# Patient Record
Sex: Male | Born: 1980
Health system: Southern US, Community
[De-identification: ages and names within clinical notes are randomized; demographics above are authoritative.]

## PROBLEM LIST (undated history)

## (undated) DIAGNOSIS — K2970 Gastritis, unspecified, without bleeding: Secondary | ICD-10-CM

## (undated) DIAGNOSIS — F1010 Alcohol abuse, uncomplicated: Secondary | ICD-10-CM

## (undated) DIAGNOSIS — K2990 Gastroduodenitis, unspecified, without bleeding: Secondary | ICD-10-CM

## (undated) DIAGNOSIS — Z9049 Acquired absence of other specified parts of digestive tract: Secondary | ICD-10-CM

## (undated) DIAGNOSIS — F419 Anxiety disorder, unspecified: Secondary | ICD-10-CM

## (undated) DIAGNOSIS — K219 Gastro-esophageal reflux disease without esophagitis: Secondary | ICD-10-CM

## (undated) DIAGNOSIS — G479 Sleep disorder, unspecified: Secondary | ICD-10-CM

## (undated) DIAGNOSIS — A048 Other specified bacterial intestinal infections: Secondary | ICD-10-CM

## (undated) HISTORY — DX: Gastro-esophageal reflux disease without esophagitis: K21.9

## (undated) HISTORY — DX: Gastroduodenitis, unspecified, without bleeding: K29.90

## (undated) HISTORY — DX: Acquired absence of other specified parts of digestive tract: Z90.49

## (undated) HISTORY — DX: Gastritis, unspecified, without bleeding: K29.70

## (undated) HISTORY — DX: Anxiety disorder, unspecified: F41.9

## (undated) HISTORY — DX: Sleep disorder, unspecified: G47.9

## (undated) HISTORY — DX: Other specified bacterial intestinal infections: A04.8

## (undated) HISTORY — DX: Alcohol abuse, uncomplicated: F10.10

---

## 2005-10-15 HISTORY — PX: ANKLE SURGERY: SHX546

## 2007-11-19 ENCOUNTER — Ambulatory Visit: Payer: Self-pay | Admitting: Internal Medicine

## 2007-11-19 LAB — CONVERTED CEMR LAB
AST: 20 units/L (ref 0–37)
Albumin: 5.1 g/dL (ref 3.5–5.2)
Alkaline Phosphatase: 76 units/L (ref 39–117)
Basophils Relative: 0 % (ref 0–1)
Eosinophils Absolute: 0 10*3/uL (ref 0.0–0.7)
Glucose, Bld: 103 mg/dL — ABNORMAL HIGH (ref 70–99)
LDL Cholesterol: 100 mg/dL — ABNORMAL HIGH (ref 0–99)
Lymphs Abs: 2.3 10*3/uL (ref 0.7–4.0)
MCV: 94.8 fL (ref 78.0–100.0)
Neutrophils Relative %: 64 % (ref 43–77)
Platelets: 226 10*3/uL (ref 150–400)
Potassium: 5 meq/L (ref 3.5–5.3)
Sodium: 139 meq/L (ref 135–145)
Total Protein: 8.3 g/dL (ref 6.0–8.3)
WBC: 8.9 10*3/uL (ref 4.0–10.5)

## 2007-12-15 ENCOUNTER — Emergency Department (HOSPITAL_COMMUNITY): Admission: EM | Admit: 2007-12-15 | Discharge: 2007-12-15 | Payer: Self-pay | Admitting: Family Medicine

## 2008-03-10 ENCOUNTER — Encounter (INDEPENDENT_AMBULATORY_CARE_PROVIDER_SITE_OTHER): Payer: Self-pay | Admitting: Family Medicine

## 2008-03-10 ENCOUNTER — Ambulatory Visit: Payer: Self-pay | Admitting: Family Medicine

## 2008-03-10 LAB — CONVERTED CEMR LAB

## 2008-04-08 ENCOUNTER — Ambulatory Visit: Payer: Self-pay | Admitting: *Deleted

## 2008-04-08 ENCOUNTER — Ambulatory Visit: Payer: Self-pay | Admitting: Family Medicine

## 2008-11-09 ENCOUNTER — Ambulatory Visit: Payer: Self-pay | Admitting: Internal Medicine

## 2008-11-09 ENCOUNTER — Encounter (INDEPENDENT_AMBULATORY_CARE_PROVIDER_SITE_OTHER): Payer: Self-pay | Admitting: Adult Health

## 2008-12-07 ENCOUNTER — Emergency Department (HOSPITAL_COMMUNITY): Admission: EM | Admit: 2008-12-07 | Discharge: 2008-12-07 | Payer: Self-pay | Admitting: Family Medicine

## 2009-01-03 ENCOUNTER — Ambulatory Visit: Payer: Self-pay | Admitting: Internal Medicine

## 2009-01-11 ENCOUNTER — Ambulatory Visit (HOSPITAL_COMMUNITY): Admission: RE | Admit: 2009-01-11 | Discharge: 2009-01-11 | Payer: Self-pay | Admitting: Internal Medicine

## 2009-06-24 ENCOUNTER — Ambulatory Visit: Payer: Self-pay | Admitting: Internal Medicine

## 2009-07-27 ENCOUNTER — Ambulatory Visit: Payer: Self-pay | Admitting: Internal Medicine

## 2011-05-13 ENCOUNTER — Inpatient Hospital Stay (INDEPENDENT_AMBULATORY_CARE_PROVIDER_SITE_OTHER)
Admission: RE | Admit: 2011-05-13 | Discharge: 2011-05-13 | Disposition: A | Discharge status: Home / Self Care | Payer: Self-pay | Source: Ambulatory Visit | Attending: Family Medicine | Admitting: Family Medicine

## 2011-05-13 DIAGNOSIS — L03039 Cellulitis of unspecified toe: Secondary | ICD-10-CM

## 2011-07-09 LAB — INFLUENZA A AND B ANTIGEN (CONVERTED LAB): Inflenza A Ag: POSITIVE — AB

## 2011-12-24 ENCOUNTER — Encounter: Payer: Self-pay | Admitting: Internal Medicine

## 2012-01-08 ENCOUNTER — Encounter: Payer: Self-pay | Admitting: Internal Medicine

## 2012-01-09 ENCOUNTER — Encounter: Payer: Self-pay | Admitting: Internal Medicine

## 2012-01-09 ENCOUNTER — Ambulatory Visit (INDEPENDENT_AMBULATORY_CARE_PROVIDER_SITE_OTHER): Payer: Self-pay | Admitting: Internal Medicine

## 2012-01-09 VITALS — BP 94/58 | HR 72 | Ht 71.5 in | Wt 221.1 lb

## 2012-01-09 DIAGNOSIS — K3189 Other diseases of stomach and duodenum: Secondary | ICD-10-CM

## 2012-01-09 DIAGNOSIS — R1013 Epigastric pain: Secondary | ICD-10-CM | POA: Insufficient documentation

## 2012-01-09 NOTE — Progress Notes (Signed)
Subjective:    Patient ID: Frank Wilcox, male    DOB: 1980/12/25, 31 y.o.   MRN: [id number]  HPI Mr. Thurnell Garbe is a 31 yo male with little PMH who is seen in consultation at the request of Dr. Venetia Night for evaluation of epigastric pain.  The patient is Spanish-speaking only and is accompanied today by a [place] interpreter.  The patient reports epigastric, burning abdominal pain over the past 7 months. This is specifically worse with eating and associated with nausea. No vomiting. No significant heartburn, dysphagia or odynophagia. No change in bowel habits including no melena or bright red blood per rectum. No weight loss and his appetite is good. He has noticed some abdominal bloating. He was given a trial of PPI therapy, and recently (about 22 days ago) completed a seven-day course of antibiotics for H. pylori infection. He reports initially this seemed to help "a lot", but has issues with burning pain have returned. He does think at this point they're not as strong as before therapy for H. pylori but definitely significant. No fever or chills.  He reports eating several cloves of garlic daily as a home remedy, and wonders if he made things worse by doing so  Review of Systems As per history of present illness, otherwise negative  Past Medical History  Diagnosis Date  . Unspecified gastritis and gastroduodenitis without mention of hemorrhage   . Esophageal reflux   . Alcohol abuse   . Anxiety   . Sleep disturbance, unspecified    No Known Allergies  Current Outpatient Prescriptions  Medication Sig Dispense Refill  . meclizine (ANTIVERT) 25 MG tablet Take 25 mg by mouth 2 (two) times daily as needed.       Marland Kitchen omeprazole (PRILOSEC) 40 MG capsule Take 40 mg by mouth daily.         Family History  Problem Relation Age of Onset  . Diabetes Mother    History   Social History  . Marital Status: Married    Spouse Name: N/A    Number of Children: 1  . Years of Education:  N/A   Occupational History  . Holiday representative Other   Social History Main Topics  . Smoking status: Current Everyday Smoker  . Smokeless tobacco: Never Used  . Alcohol Use: Yes     1-2 per day  . Drug Use: No  . Sexually Active: None   Other Topics Concern  . None   Social History Narrative  . None       Objective:   Physical Exam BP 94/58  Pulse 72  Ht 5' 11.5" (1.816 m)  Wt 221 lb 2 oz (100.302 kg)  BMI 30.41 kg/m2 Constitutional: Well-developed and well-nourished. No distress. HEENT: Normocephalic and atraumatic. Oropharynx is clear and moist. No oropharyngeal exudate. Conjunctivae are normal. Pupils are equal round and reactive to light. No scleral icterus. Neck: Neck supple. Trachea midline. Cardiovascular: Normal rate, regular rhythm and intact distal pulses. No M/R/G Pulmonary/chest: Effort normal and breath sounds normal. No wheezing, rales or rhonchi. Abdominal: Soft, mild epigastric pain without rebound or guarding, nondistended. Bowel sounds active throughout. There are no masses palpable. No hepatosplenomegaly. Extremities: no clubbing, cyanosis, or edema Lymphadenopathy: No cervical adenopathy noted. Neurological: Alert and oriented to person place and time. Skin: Skin is warm and dry. No rashes noted. Psychiatric: Normal mood and affect. Behavior is normal.     Assessment & Plan:   31 yo male with little PMH who is seen in consultation at the  request of Dr. Venetia Night for evaluation of epigastric pain  1. Epigastric pain -- the patient's symptoms are consistent with dyspepsia and possibly related H. pylori infection. I'm not sure if he was objectively tested for H. pylori infection, but he did complete therapy. This seemed to have helped, but his symptoms have returned, which raises the question of incomplete treatment of H. pylori infection. He has remained on daily omeprazole 40 mg, and despite this still has symptoms. Based on these factors, I recommended upper  endoscopy. We discussed the test today including risks and benefits and he is agreeable to proceed. I will plan a gastric biopsies to evaluate for H. pylori at this examination. For now I recommended he continue his daily PPI therapy. Further recommendations can be made after EGD

## 2012-01-09 NOTE — Patient Instructions (Addendum)
You have been given a separate informational sheet regarding your tobacco use, the importance of quitting and local resources to help you quit. You have been scheduled for an endoscopy with propofol. Please follow written instructions given to you at your visit today.  

## 2012-01-31 ENCOUNTER — Ambulatory Visit (AMBULATORY_SURGERY_CENTER): Payer: Self-pay | Admitting: Internal Medicine

## 2012-01-31 ENCOUNTER — Encounter: Payer: Self-pay | Admitting: Internal Medicine

## 2012-01-31 VITALS — BP 124/108 | HR 68 | Temp 96.0°F | Resp 18 | Ht 72.0 in | Wt 221.0 lb

## 2012-01-31 DIAGNOSIS — Z8619 Personal history of other infectious and parasitic diseases: Secondary | ICD-10-CM

## 2012-01-31 DIAGNOSIS — K2970 Gastritis, unspecified, without bleeding: Secondary | ICD-10-CM

## 2012-01-31 DIAGNOSIS — R1013 Epigastric pain: Secondary | ICD-10-CM

## 2012-01-31 DIAGNOSIS — K2990 Gastroduodenitis, unspecified, without bleeding: Secondary | ICD-10-CM

## 2012-01-31 MED ORDER — SODIUM CHLORIDE 0.9 % IV SOLN: 500.0000 mL | INTRAVENOUS | Status: DC

## 2012-01-31 NOTE — Op Note (Signed)
[facility] [address] [place], [place]  [address]  ENDOSCOPY PROCEDURE REPORT  PATIENT:  Frank Wilcox, Frank Wilcox  MR#:  [id number] BIRTHDATE:  May 01, 1981, 30 yrs. old  GENDER:  male ENDOSCOPIST:  Carie Caddy. Adna Nofziger, MD  PROCEDURE DATE:  01/31/2012 PROCEDURE:  EGD with biopsy for H. pylori 19147 ASA CLASS:  Class I INDICATIONS:  epigastric pain, dyspepsia, history of MEDICATIONS:   MAC sedation, administered by CRNA, propofol (Diprivan) 400 mg IV TOPICAL ANESTHETIC:  none  DESCRIPTION OF PROCEDURE:   After the risks benefits and alternatives of the procedure were thoroughly explained, informed consent was obtained.  The LB GIF-H180 [id number] endoscope was introduced through the mouth and advanced to the second portion of the duodenum, without limitations.  The instrument was slowly withdrawn as the mucosa was fully examined. <<PROCEDUREIMAGES>> The esophagus and gastroesophageal junction were completely normal in appearance.  Mild gastritis was found in the body and the antrum of the stomach. Biopsies of the antrum and body of the stomach were obtained and sent to pathology.  The duodenal bulb was normal in appearance, as was the postbulbar duodenum. Retroflexed views revealed no abnormalities.    The scope was then withdrawn from the patient and the procedure completed.  COMPLICATIONS:  None  ENDOSCOPIC IMPRESSION: 1) Normal esophagus 2) Mild gastritis in the body and the antrum of the stomach 3) Normal duodenum RECOMMENDATIONS: 1) Await pathology results 2) Continue taking your PPI (antiacid medicine, omeprazole 40 mg) once daily. It is best to be taken 20-30 minutes prior to breakfast meal.  Carie Caddy. Dyllan Kats, MD  CC:  The Patient Dr. Venetia Night  n. eSIGNED:   Carie Caddy. Cythina Mickelsen at 01/31/2012 04:15 PM  Aliene Beams, [phone]

## 2012-01-31 NOTE — Progress Notes (Signed)
Patient did not experience any of the following events: a burn prior to discharge; a fall within the facility; wrong site/side/patient/procedure/implant event; or a hospital transfer or hospital admission upon discharge from the facility. (G8907) Patient did not have preoperative order for IV antibiotic SSI prophylaxis. (G8918)  

## 2012-01-31 NOTE — Patient Instructions (Addendum)
Discharge instructions given with verbal understanding. Handouts on gastritis given.  Resume previous medications.YOU HAD AN ENDOSCOPIC PROCEDURE TODAY AT THE [facility]: Refer to the procedure report that was given to you for any specific questions about what was found during the examination.  If the procedure report does not answer your questions, please call your gastroenterologist to clarify.  If you requested that your care partner not be given the details of your procedure findings, then the procedure report has been included in a sealed envelope for you to review at your convenience later.  YOU SHOULD EXPECT: Some feelings of bloating in the abdomen. Passage of more gas than usual.  Walking can help get rid of the air that was put into your GI tract during the procedure and reduce the bloating. If you had a lower endoscopy (such as a colonoscopy or flexible sigmoidoscopy) you may notice spotting of blood in your stool or on the toilet paper. If you underwent a bowel prep for your procedure, then you may not have a normal bowel movement for a few days.  DIET: Your first meal following the procedure should be a light meal and then it is ok to progress to your normal diet.  A half-sandwich or bowl of soup is an example of a good first meal.  Heavy or fried foods are harder to digest and may make you feel nauseous or bloated.  Likewise meals heavy in dairy and vegetables can cause extra gas to form and this can also increase the bloating.  Drink plenty of fluids but you should avoid alcoholic beverages for 24 hours.  ACTIVITY: Your care partner should take you home directly after the procedure.  You should plan to take it easy, moving slowly for the rest of the day.  You can resume normal activity the day after the procedure however you should NOT DRIVE or use heavy machinery for 24 hours (because of the sedation medicines used during the test).    SYMPTOMS TO REPORT IMMEDIATELY: A  gastroenterologist can be reached at any hour.  During normal business hours, 8:30 AM to 5:00 PM Monday through Friday, call [phone].  After hours and on weekends, please call the GI answering service at [phone] who will take a message and have the physician on call contact you.   Following upper endoscopy (EGD)  Vomiting of blood or coffee ground material  New chest pain or pain under the shoulder blades  Painful or persistently difficult swallowing  New shortness of breath  Fever of 100F or higher  Black, tarry-looking stools  FOLLOW UP: If any biopsies were taken you will be contacted by phone or by letter within the next 1-3 weeks.  Call your gastroenterologist if you have not heard about the biopsies in 3 weeks.  Our staff will call the home number listed on your records the next business day following your procedure to check on you and address any questions or concerns that you may have at that time regarding the information given to you following your procedure. This is a courtesy call and so if there is no answer at the home number and we have not heard from you through the emergency physician on call, we will assume that you have returned to your regular daily activities without incident.  SIGNATURES/CONFIDENTIALITY: You and/or your care partner have signed paperwork which will be entered into your electronic medical record.  These signatures attest to the fact that that the information above on  your After Visit Summary has been reviewed and is understood.  Full responsibility of the confidentiality of this discharge information lies with you and/or your care-partner.     Gastritis (Gastritis) La gastritis es una inflamacin (reaccin del organismo a una lesin o infeccin) del Teaching laboratory technician. A menudo la causan infecciones virales o bacterianas (grmenes). Otras causas pueden ser ciertas sustancias qumicas (incluyendo el alcohol) y los medicamentos. Esta enfermedad puede estar  asociada a un malestar generalizado (sentirse cansado y mal), calambres y [place]. La enfermedad puede durar [employer] y [place]. Si los sntomas de gastritis continan, podr ser necesario someterse a una gastroscopa (observacin del estmago con un instrumento similar a un telescopio), a Physiological scientist biopsia (extraccin de Lauris Poag de tejido), y/o a pruebas de [place]. Los antibiticos no sern de utilidad a menos que la causa sea una infeccin bacteriana. Cuando la causa es [place], puede deberse a un organismo conocido como H. pylori. En este caso puede tratarse con antibiticos. Otras formas de gastritis se deben a una produccin excesiva de cido en el estmago. Esto puede tratarse con [employer] bloqueadores H2 y los anticidos. Generalmente todo lo que se necesita es un Medical laboratory scientific officer. Los nios pequeos podrn deshidratarse (perder los lquidos del organismo) con facilidad si los vmitos continan junto con la diarrea. Pueden administrarse algunos medicamentos que controlarn las nuseas. En general no se prescriben medicamentos para la diarrea, a menos que sea particularmente molesta. Algunos medicamentos retrasan la eliminacin de los virus del tracto gastrointestinal. Este factor hace ms lento el proceso de curacin. INSTRUCCIONES PARA EL CUIDADO DOMICILIARIO Instrucciones para el cuidado domiciliario para las nuseas y los vmitos:  Para los adultos: beba con frecuencia cantidades pequeas de lquido. Beba al menos 2 litros por da. Beba a sorbos con frecuencia. No beba grandes cantidades de lquido de [employer]. As podr [employer] nuseas.   Utilice los medicamentos de venta libre o de prescripcin para Chief Technology Officer, Environmental health practitioner o la [place], segn se lo indique el profesional que lo asiste.   Beba slo lquidos claros. Son los lquidos transparentes como el agua, [place], u bebidas ligeras.   Una vez que usted [employer] lquidos claros, puede tomar cualquier lquido, sopas,  jugos y helados o sorbetes. Agregue lentamente alimentos blandos (sin condimentar) a la dieta.  Instrucciones para el cuidado domiciliario en los [place] de diarrea:  La diarrea puede estar causada por una infeccin bacteriana o un virus. Su problema debe mejorar con el transcurso del [place], reposo, lquidos y/o los medicamentos antidiarreicos.   Hasta que la diarrea est bajo control, usted deber beber lquidos claros en pequeas cantidades, con frecuencia. Los lquidos claros incluyen: agua, caldo, gelatina, y t liviano.  Evite:  Leche.   Nils Pyle.   Tabaco.   Alcohol.   Lquidos muy calientes o fros.   Comer demasiado a Licensed conveyancer.  Cuando la diarrea se Chief Strategy Officer, puede agregar los siguientes alimentos que ayudan a formar las heces:  Surveyor, minerals.   Pltanos.   Manzanas sin cscara.   Tostadas secas.  Una vez que estos alimentos son [place], puede agregar yogur con bajo contenido de grasa y requesn con bajo contenido de [place]. Estos alimentos harn que recupere el equilibrio de las bacterias intestinales. Lave bien sus manos para evitar que la bacteria o el virus se diseminen. SOLICITE ATENCIN MDICA DE INMEDIATO SI:  No puede retener lquidos.   Los vmitos o la diarrea se Therapist, music (se repiten una y Laverda Page).  Aparece dolor abdominal, o ste aumenta o se localiza. Un dolor ubicado hacia el lado derecho del abdomen puede deberse a apendicitis. Un dolor en un adulto ubicado en el lado izquierdo del abdomen puede sugerir una diverticulitis.   Aparece fiebre alta, entendida como una temperatura de ms de 102 F (38.9 C), o la temperatura que le haya indicado el profesional que lo asiste, durante ms de [address].   La diarrea se hace excesiva o contiene sangre o mucosidad.   Presenta debilidad excesiva, mareos, lipotimia o sed extrema.  Document Released: 07/11/2005 Document Revised: 09/20/2011 [facility] Patient Information 2012 [place], [place].

## 2012-02-07 ENCOUNTER — Encounter: Payer: Self-pay | Admitting: Internal Medicine

## 2012-04-09 ENCOUNTER — Encounter (HOSPITAL_COMMUNITY): Payer: Self-pay | Admitting: *Deleted

## 2012-04-09 ENCOUNTER — Emergency Department (INDEPENDENT_AMBULATORY_CARE_PROVIDER_SITE_OTHER)
Admission: EM | Admit: 2012-04-09 | Discharge: 2012-04-09 | Disposition: A | Discharge status: Home / Self Care | Payer: Self-pay | Source: Home / Self Care | Attending: Emergency Medicine | Admitting: Emergency Medicine

## 2012-04-09 ENCOUNTER — Other Ambulatory Visit: Payer: Self-pay

## 2012-04-09 DIAGNOSIS — R42 Dizziness and giddiness: Secondary | ICD-10-CM

## 2012-04-09 DIAGNOSIS — F411 Generalized anxiety disorder: Secondary | ICD-10-CM

## 2012-04-09 DIAGNOSIS — F419 Anxiety disorder, unspecified: Secondary | ICD-10-CM

## 2012-04-09 LAB — POCT I-STAT, CHEM 8
Calcium, Ion: 1.19 mmol/L (ref 1.12–1.32)
Glucose, Bld: 89 mg/dL (ref 70–99)
HCT: 49 % (ref 39.0–52.0)
Hemoglobin: 16.7 g/dL (ref 13.0–17.0)

## 2012-04-09 MED ORDER — HYDROXYZINE HCL 25 MG PO TABS: ORAL_TABLET | ORAL | Status: DC

## 2012-04-09 MED ORDER — MECLIZINE HCL 25 MG PO TABS: ORAL_TABLET | ORAL | Status: DC

## 2012-04-09 NOTE — Discharge Instructions (Signed)
Ansiedad y crisis de [place] (Anxiety and Panic Attacks) El profesional que lo asiste le ha informado que usted padece ansiedad o crisis de [place]. Este trastorno puede presentarse de [employer]. La mayor parte de las veces las crisis aparecen de modo repentino y sin aviso. Se producen en cualquier momento del da, incluso durante el sueo, y en cualquier etapa de la vida. Pueden ser muy intensas e inexplicadas. Aunque un ataque de pnico puede ser muy atemorizante, no produce daos fsicos. Algunas veces se desconoce la causa de la ansiedad. La ansiedad es un mecanismo protector del organismo en su respuesta de lucha o escape. Muchas de estas situaciones de percepcin de peligro son en realidad situaciones no fsicas (como la ansiedad de perder el [place]). CAUSAS Las causas de la ansiedad o de un ataque de pnico pueden ser [place]. Los ataques de pnico pueden ocurrir en personas sanas en una serie de circunstancias. Es posible que haya una causa gentica de los ataques de pnico. Algunos medicamentos pueden provocar ansiedad como efecto secundario. SNTOMAS Algunas de las sensaciones ms comunes son:  Terror intenso.   Vahdos, desfallecimiento.   Golpes de fro y Airline pilot.   Temor a Estate manager/land agent.   Sentimiento de irrealidad.   Sudoracin.   Temblores.   Dolor en el pecho y latidos irregulares (palpitaciones).   Sensaciones de [employer] o sofocos.   Sentimiento de peligro inminente y de que la muerte est prxima.   Hormigueo en las extremidades que puede venir de la respiracin agitada.   Alteracin de la realidad (desrealizacin).   Sentirse separado de uno mismo (despersonalizacin).  Estos son los sntomas (problemas) ms comunes y pueden combinarse para presentar la crisis de [place].  DIAGNSTICO La evaluacin que realice el profesional depender del tipo de sntomas que est experimentando. El diagnstico de la ansiedad o de ataque de pnico se realiza cuando no se  encuentra ninguna enfermedad fsica que pueda determinarse como la causa de los sntomas. TRATAMIENTO El tratamiento para prevenir la ansiedad y los ataques de pnico incluye:  Automotive engineer las circunstancias que causen ansiedad.   Reaseguro y relajacin.   Ejercicio regular.   Terapias de relajacin, como yoga.   Psicoterapia con un psiquiatra o terapeuta.   Evitar la cafena, el alcohol y las drogas [address]   Medicamentos de prescripcin.  SOLICITE ATENCIN MDICA DE INMEDIATO SI:  Usted experimenta sntomas de ataque de pnico que son distintos a sus sntomas usuales.   Tiene cualquier sntoma que empeora o lo preocupa.  Document Released: 10/01/2005 Document Revised: 09/20/2011 [facility] Patient Information 2012 [place], [place].Vrtigo (Vertigo)  Vrtigo es la sensacin de que se est moviendo estando quieto. Puede ser peligroso si ocurre cuando est trabajado, conduciendo vehculos o realizando actividades difciles.  CAUSAS  El vrtigo se produce cuando hay un conflicto en las seales que se envan al cerebro desde los sistemas visual y sensorial del cuerpo. Hay numerosas causas que [employer], entre las que se incluyen:   Infecciones, especialmente en el odo interno.   Nelia Shi reaccin a un medicamento o mal uso de alcohol y frmacos.   Abstinencia de drogas o alcohol.   Cambios rpidos de posicin, como al [employer] o darse vuelta en la cama.   Dolor de Surveyor, minerals.   Disminucin del flujo sanguneo hacia el cerebro.   Aumento de la presin en el cerebro por un traumatismo, infeccin, tumor o sangrado en la cabeza.  SNTOMAS  Puede sentir como si el mundo da vueltas o va a  caer al piso. Como hay problemas en el equilibrio, el vrtigo puede causar nuseas y vmitos. Tiene movimientos oculares involuntarios (nistagmus).  DIAGNSTICO  El vrtigo normalmente se diagnostica con un examen fsico. Si la causa no se conoce, el mdico puede indicar  diagnstico por imgenes, como una resonancia magntica (imgenes por Health visitor).  TRATAMIENTO  La mayor parte de los casos de vrtigo se resuelve sin TEFL teacher. Segn la causa, el mdico podr recetar ciertos medicamentos. Si se relaciona con la posicin del cuerpo, podr recomendarle movimientos o procedimientos para corregir el problema. En algunos casos raros, si la causa del vrtigo es un problema en el odo interno, necesitar [place].  INSTRUCCIONES PARA EL CUIDADO DOMICILIARIO  Siga las indicaciones del mdico.   Evite conducir vehculos.   Evite operar maquinarias pesadas.   Evite realizar tareas que seran peligrosas para usted u otras personas durante un episodio de vrtigo.   Comunquele al mdico si nota que ciertos medicamentos parecen asociarse con las crisis. Algunos medicamentos que se usan para tratar los episodios, en [employer].  SOLICITE ATENCIN MDICA DE INMEDIATO SI:  Los medicamentos no [place] las crisis o hacen que estas empeoren.   Tiene dificultad para hablar, caminar, siente debilidad o tiene problemas para [employer], las manos o las piernas.   Comienza a sufrir un dolor de cabeza intenso.   Las nuseas y los vmitos no se [place] o se Press photographer.   Aparecen trastornos visuales.   Un miembro de su familia nota cambios en su conducta.   Hay alguna modificacin en su trastorno que parece Holiday representative de Scientist, clinical (histocompatibility and immunogenetics).  ASEGRESE DE QUE:   Comprende estas instrucciones.   Controlar su enfermedad.   Solicitar ayuda de inmediato si no mejora o si empeora.  Document Released: 07/11/2005 Document Revised: 09/20/2011 [facility] Patient Information 2012 [place], [place].

## 2012-04-09 NOTE — ED Provider Notes (Signed)
Chief Complaint  Patient presents with  . Dizziness    History of Present Illness:  The patient is a 31 year old male who last Thursday 6 days ago, felt dizzy all day. He describes whirling vertigo this was non-positional and fairly constant throughout the day. He felt somewhat nauseated, tired and fatigued. He denies any ear symptoms, nasal congestion, headache, diplopia, blurred vision, or neurological complaints. His vertigo got better. He's felt mildly dizzy since then but not severely so. Ever since then he hasn't quite felt right previous felt somewhat nauseated, tense, nervous, worry, and fatigue. He's had muscle spasms in sharp stabbing pains in his arms and his legs off and on. He denies any fevers, chills, chest pain, shortness of breath, abdominal pain, vomiting, or diarrhea. [facility] is been taking. He admits to being nervous and anxious. He's concerned about diabetes and is been checking his sugars at home and they've all been within the normal range. He still would like to be checked for diabetes.  Review of Systems:  Other than noted above, the patient denies any of the following symptoms: Systemic:  No fever, chills, fatigue, or weight loss. Eye:  No blurred vision, visual change or diplopia. ENT:  No ear pain, tinnitus, hearing loss, nasal congestion, or rhinorrhea. Cardiac:  No chest pain, dyspnea, palpitations or syncope. Neuro:  No headache, paresthesias, weakness, trouble with speech, coordination or ambulation.  PMFSH:  Past medical history, family history, social history, meds, and allergies were reviewed.  Physical Exam:   Vital signs:  BP 114/74  Pulse 87  Temp 98.3 F (36.8 C) (Oral)  Resp 16  SpO2 98% Filed Vitals:   04/09/12 1131 04/09/12 1152 supine 04/09/12 1153 sitting 04/09/12 1154 standing  BP: 96/59 100/64 114/74 114/74  Pulse: 78 72 80 87  Temp: 98.3 F (36.8 C)     TempSrc: Oral     Resp: 16      SpO2: 98%      General:  Alert, oriented times 3, in no distress. Eye:  PERRL, full EOM, no nystagmus. ENT:  TMs and canals normal.  Nasal mucosa normal.  Pharynx clear. Neck:  No adenopathy, tenderness, or mass.  Thyroid normal.  No carotid bruit. Lungs:  Breath sounds clear and equal bilaterally.  No wheezes, rales or rhonchi. Heart:  Regular rhythm.  No gallops, murmers, or rubs. Neuro:  Alert and oriented times 3.  Cranial nerves intact.  No pronator drift.  Finger to nose normal.  No focal weakness.  Sensation intact to light touch.  Romberg's sign negative, gait normal.  Able to do tandem gait well.   Labs:   Results for orders placed during the hospital encounter of 04/09/12  POCT I-STAT, CHEM 8      Component Value Range   Sodium 141  135 - 145 mEq/L   Potassium 3.9  3.5 - 5.1 mEq/L   Chloride 102  96 - 112 mEq/L   BUN 19  6 - 23 mg/dL   Creatinine, Ser 1.61  0.50 - 1.35 mg/dL   Glucose, Bld 89  70 - 99 mg/dL   Calcium, Ion 0.96  0.45 - 1.32 mmol/L   TCO2 25  0 - 100 mmol/L   Hemoglobin 16.7  13.0 - 17.0 g/dL   HCT 40.9  81.1 - 91.4 %      Date: 04/09/2012  Rate: 57  Rhythm: normal sinus rhythm  QRS Axis: normal  Intervals: normal  ST/T  Wave abnormalities: normal  Conduction Disutrbances:none  Narrative Interpretation: Sinus bradycardia, otherwise normal EKG.  Old EKG Reviewed: none available   Assessment:  The primary encounter diagnosis was Vertigo. A diagnosis of Anxiety was also pertinent to this visit. I think he had an episode of vertigo, possibly acute labyrinthitis or benign positional vertigo. He's overworked in that right now. Right now his main issue seems to be anxiety and worry about diabetes. I've urged him to followup with his primary care physician within the next 2 weeks.  Plan:   1.  The following meds were prescribed:   New Prescriptions   HYDROXYZINE (ATARAX/VISTARIL) 25 MG TABLET    Take 1 every 6 hours as needed for anxiety.   MECLIZINE  (ANTIVERT) 25 MG TABLET    Take 1 every 6 hours as needed for dizziness.   2.  The patient was instructed in symptomatic care and handouts were given. 3.  The patient was told to return if becoming worse in any way, if no better in 3 or 4 days, and given some red flag symptoms that would indicate earlier return.    [name], MD 04/09/12 [phone]

## 2012-04-09 NOTE — ED Notes (Signed)
Pt reports he is taking a med Rx'd by [facility] for GERD about 4 months.   6  days ago he had worsening of the GERD sxs with dizziness, anxiety   Today the dizziness is slight and intermittent.   Yesterday at work, he reports the bottom of both  feet felt very hot

## 2012-04-10 NOTE — ED Notes (Signed)
Shirlee Limerick from the [facility] called about a referral that was sent. She said she needs a diagnosis code. I told her Dr. Lorenz Coaster would be in at 1600 and I would call her back. Discussed with Dr. Lorenz Coaster and he said pt.does not have diabetes and does not have diagnosis code that would help with the referral. He said pt. is just anxious and wanted to talk to a nutritionist. He said to cancel the referral. I called Shirlee Limerick and told her this. She said she would take it out. Vassie Moselle 04/10/2012

## 2012-05-31 ENCOUNTER — Emergency Department (HOSPITAL_COMMUNITY)
Admission: EM | Admit: 2012-05-31 | Discharge: 2012-05-31 | Disposition: A | Discharge status: Home / Self Care | Payer: Self-pay | Source: Home / Self Care | Attending: Emergency Medicine | Admitting: Emergency Medicine

## 2012-05-31 ENCOUNTER — Encounter (HOSPITAL_COMMUNITY): Payer: Self-pay | Admitting: *Deleted

## 2012-05-31 DIAGNOSIS — K2970 Gastritis, unspecified, without bleeding: Secondary | ICD-10-CM

## 2012-05-31 DIAGNOSIS — K2990 Gastroduodenitis, unspecified, without bleeding: Secondary | ICD-10-CM

## 2012-05-31 MED ORDER — OMEPRAZOLE 40 MG PO CPDR: 40.0000 mg | DELAYED_RELEASE_CAPSULE | Freq: Every day | ORAL | Status: DC

## 2012-05-31 NOTE — ED Provider Notes (Signed)
History     CSN: [id number]  Arrival date & time 05/31/12  1734   First MD Initiated Contact with Patient 05/31/12 1919      Chief Complaint  Patient presents with  . Abdominal Pain    (Consider location/radiation/quality/duration/timing/severity/associated sxs/prior treatment) HPI Comments: Pt with hx gastritis.  Review of previous records shows EGD in April 2013 that was negative.  Pt was taking omeprazole 40mg  qAM.  Ran out a week ago and pain began 2 weeks ago.  Pt reports pain started 2 weeks ago when he ate hot peppers, tomatoes and drank 48oz of beer. Pt feels pain is same as his usual epigastric pain. Pt is also worried about his liver since he drinks 48oz of beer at least once a week.  Wants his liver checked.   Patient is a 31 y.o. male presenting with abdominal pain. The history is provided by the patient. A language interpreter was used.  Abdominal Pain The primary symptoms of the illness include abdominal pain and nausea. The primary symptoms of the illness do not include fever, vomiting or diarrhea. Episode onset: 2 weeks ago. The onset of the illness was gradual. The problem has been gradually worsening.  The abdominal pain is located in the epigastric region. The abdominal pain does not radiate. The severity of the abdominal pain is 6/10. The abdominal pain is relieved by nothing.  Symptoms associated with the illness do not include chills.    Past Medical History  Diagnosis Date  . Unspecified gastritis and gastroduodenitis without mention of hemorrhage   . Esophageal reflux   . Alcohol abuse   . Anxiety   . Sleep disturbance, unspecified     Past Surgical History  Procedure Date  . Ankle surgery     right    Family History  Problem Relation Age of Onset  . Diabetes Mother     History  Substance Use Topics  . Smoking status: Former Games developer  . Smokeless tobacco: Never Used  . Alcohol Use: 0.0 oz/week     occasional      Review of Systems    Constitutional: Negative for fever and chills.  Gastrointestinal: Positive for nausea and abdominal pain. Negative for vomiting and diarrhea.    Allergies  Review of patient's allergies indicates no known allergies.  Home Medications   Current Outpatient Rx  Name Route Sig Dispense Refill  . HYDROXYZINE HCL 25 MG PO TABS  Take 1 every 6 hours as needed for anxiety. 30 tablet 0  . MECLIZINE HCL 25 MG PO TABS Oral Take 25 mg by mouth 2 (two) times daily as needed.     . MECLIZINE HCL 25 MG PO TABS  Take 1 every 6 hours as needed for dizziness. 30 tablet 0  . OMEPRAZOLE 40 MG PO CPDR Oral Take 40 mg by mouth daily.    Marland Kitchen OMEPRAZOLE 40 MG PO CPDR Oral Take 1 capsule (40 mg total) by mouth daily. 30 capsule 3    BP 125/73  Pulse 71  Temp 98.3 F (36.8 C) (Oral)  Resp 17  SpO2 100%  Physical Exam  Constitutional: He appears well-developed and well-nourished. No distress.  Cardiovascular: Normal rate and regular rhythm.   Pulmonary/Chest: Effort normal and breath sounds normal.  Abdominal: Soft. Normal appearance and bowel sounds are normal. There is no hepatomegaly. There is tenderness in the epigastric area. There is no rigidity, no rebound and no guarding.       Mild tenderness to palp  ED Course  Procedures (including critical care time)  Labs Reviewed - No data to display No results found.   1. Gastritis       MDM  History obtained with help of interpreter.  Explained to pt alcohol, peppers and tomatoes will make his gastritis worse.  Explained we don't do routine liver tests here in Urgent Care.  Discussed need to eliminate foods that trigger gastritis sx, including alcohol, pt agrees. [facility], rx omeprazole with 3 refills.         Cathlyn Parsons, NP 05/31/12 2016

## 2012-05-31 NOTE — ED Provider Notes (Signed)
Medical screening examination/treatment/procedure(s) were performed by non-physician practitioner and as supervising physician I was immediately available for consultation/collaboration.  Nancyann Cotterman, M.D.   Henchy Mccauley C Ocie Tino, MD 05/31/12 2048 

## 2012-05-31 NOTE — ED Notes (Addendum)
C/O epigastric pain x 2 wks.  Reports having had an endoscopy approx 3 months ago - was told everything was good and given med for gastritis (Prilosec).  States the medicine helped a lot, but he has run out.  This pain feels the same as when he had gastritis.  Denies v/d, fevers.  Has tried Pepto without relief.

## 2012-06-06 ENCOUNTER — Emergency Department (HOSPITAL_COMMUNITY): Payer: Self-pay | Admitting: Anesthesiology

## 2012-06-06 ENCOUNTER — Encounter (HOSPITAL_COMMUNITY)
Admission: EM | Disposition: A | Discharge status: Home / Self Care | Payer: Self-pay | Source: Home / Self Care | Attending: Emergency Medicine

## 2012-06-06 ENCOUNTER — Emergency Department (HOSPITAL_COMMUNITY): Payer: Self-pay

## 2012-06-06 ENCOUNTER — Encounter (HOSPITAL_COMMUNITY): Payer: Self-pay | Admitting: Emergency Medicine

## 2012-06-06 ENCOUNTER — Ambulatory Visit (HOSPITAL_COMMUNITY)
Admission: EM | Admit: 2012-06-06 | Discharge: 2012-06-07 | Disposition: A | Discharge status: Home / Self Care | Payer: Self-pay | Attending: General Surgery | Admitting: General Surgery

## 2012-06-06 ENCOUNTER — Encounter (HOSPITAL_COMMUNITY): Payer: Self-pay | Admitting: Anesthesiology

## 2012-06-06 DIAGNOSIS — K3189 Other diseases of stomach and duodenum: Secondary | ICD-10-CM | POA: Insufficient documentation

## 2012-06-06 DIAGNOSIS — K3580 Unspecified acute appendicitis: Secondary | ICD-10-CM

## 2012-06-06 DIAGNOSIS — R1031 Right lower quadrant pain: Secondary | ICD-10-CM | POA: Insufficient documentation

## 2012-06-06 DIAGNOSIS — R1013 Epigastric pain: Secondary | ICD-10-CM | POA: Insufficient documentation

## 2012-06-06 HISTORY — PX: LAPAROSCOPIC APPENDECTOMY: SHX408

## 2012-06-06 LAB — CBC WITH DIFFERENTIAL/PLATELET
Basophils Relative: 0 % (ref 0–1)
Eosinophils Absolute: 0.1 10*3/uL (ref 0.0–0.7)
Eosinophils Relative: 1 % (ref 0–5)
Hemoglobin: 16 g/dL (ref 13.0–17.0)
MCH: 31.6 pg (ref 26.0–34.0)
MCHC: 34.3 g/dL (ref 30.0–36.0)
MCV: 92.1 fL (ref 78.0–100.0)
Monocytes Relative: 5 % (ref 3–12)
Neutrophils Relative %: 79 % — ABNORMAL HIGH (ref 43–77)

## 2012-06-06 LAB — COMPREHENSIVE METABOLIC PANEL
Albumin: 4.4 g/dL (ref 3.5–5.2)
Alkaline Phosphatase: 63 U/L (ref 39–117)
BUN: 17 mg/dL (ref 6–23)
Calcium: 9.7 mg/dL (ref 8.4–10.5)
Creatinine, Ser: 0.8 mg/dL (ref 0.50–1.35)
GFR calc Af Amer: >90 mL/min (ref >90)
Potassium: 4.4 mEq/L (ref 3.5–5.1)
Total Protein: 7.7 g/dL (ref 6.0–8.3)

## 2012-06-06 LAB — URINALYSIS, ROUTINE W REFLEX MICROSCOPIC
Bilirubin Urine: NEGATIVE
Ketones, ur: NEGATIVE mg/dL
Nitrite: NEGATIVE
Urobilinogen, UA: 0.2 mg/dL (ref 0.0–1.0)
pH: 5.5 (ref 5.0–8.0)

## 2012-06-06 SURGERY — APPENDECTOMY, LAPAROSCOPIC
Anesthesia: General | Wound class: Contaminated

## 2012-06-06 MED ORDER — SUCCINYLCHOLINE CHLORIDE 20 MG/ML IJ SOLN
INTRAMUSCULAR | Status: DC | PRN
  Administered 2012-06-06: 100 mg via INTRAVENOUS

## 2012-06-06 MED ORDER — IOHEXOL 300 MG/ML  SOLN
100.0000 mL | Freq: Once | INTRAMUSCULAR | Status: AC | PRN
  Administered 2012-06-06: 100 mL via INTRAVENOUS

## 2012-06-06 MED ORDER — LIDOCAINE HCL (CARDIAC) 20 MG/ML IV SOLN
INTRAVENOUS | Status: DC | PRN
  Administered 2012-06-06: 50 mg via INTRAVENOUS

## 2012-06-06 MED ORDER — PROPOFOL 10 MG/ML IV BOLUS
INTRAVENOUS | Status: DC | PRN
  Administered 2012-06-06: 200 mg via INTRAVENOUS

## 2012-06-06 MED ORDER — SODIUM CHLORIDE 0.9 % IV BOLUS (SEPSIS)
1000.0000 mL | Freq: Once | INTRAVENOUS | Status: AC
  Administered 2012-06-06: 1000 mL via INTRAVENOUS

## 2012-06-06 MED ORDER — ROCURONIUM BROMIDE 100 MG/10ML IV SOLN
INTRAVENOUS | Status: DC | PRN
  Administered 2012-06-06: 30 mg via INTRAVENOUS

## 2012-06-06 MED ORDER — SODIUM CHLORIDE 0.9 % IV SOLN
INTRAVENOUS | Status: DC
  Administered 2012-06-06 (×2): via INTRAVENOUS

## 2012-06-06 MED ORDER — SODIUM CHLORIDE 0.9 % IR SOLN
Status: DC | PRN
  Administered 2012-06-06: 2

## 2012-06-06 MED ORDER — PIPERACILLIN-TAZOBACTAM 3.375 G IVPB
3.3750 g | Freq: Three times a day (TID) | INTRAVENOUS | Status: AC
  Administered 2012-06-06: 3.375 g via INTRAVENOUS
  Filled 2012-06-06: qty 50

## 2012-06-06 MED ORDER — DIPHENHYDRAMINE HCL 50 MG/ML IJ SOLN
25.0000 mg | Freq: Once | INTRAMUSCULAR | Status: AC
  Administered 2012-06-06: 25 mg via INTRAVENOUS
  Filled 2012-06-06: qty 1

## 2012-06-06 MED ORDER — PIPERACILLIN-TAZOBACTAM 3.375 G IVPB
3.3750 g | Freq: Once | INTRAVENOUS | Status: AC
  Administered 2012-06-06: 3.375 g via INTRAVENOUS
  Filled 2012-06-06: qty 50

## 2012-06-06 MED ORDER — FAMOTIDINE IN NACL 20-0.9 MG/50ML-% IV SOLN
20.0000 mg | Freq: Two times a day (BID) | INTRAVENOUS | Status: DC
  Administered 2012-06-06: 20 mg via INTRAVENOUS
  Filled 2012-06-06 (×2): qty 50

## 2012-06-06 MED ORDER — ONDANSETRON HCL 4 MG/2ML IJ SOLN: 4.0000 mg | Freq: Three times a day (TID) | INTRAMUSCULAR | Status: DC | PRN

## 2012-06-06 MED ORDER — MORPHINE SULFATE 4 MG/ML IJ SOLN
4.0000 mg | Freq: Once | INTRAMUSCULAR | Status: AC
  Administered 2012-06-06: 4 mg via INTRAVENOUS
  Filled 2012-06-06: qty 1

## 2012-06-06 MED ORDER — MIDAZOLAM HCL 5 MG/5ML IJ SOLN
INTRAMUSCULAR | Status: DC | PRN
  Administered 2012-06-06: 2 mg via INTRAVENOUS

## 2012-06-06 MED ORDER — PANTOPRAZOLE SODIUM 40 MG PO TBEC
40.0000 mg | DELAYED_RELEASE_TABLET | Freq: Every day | ORAL | Status: DC
  Administered 2012-06-06 – 2012-06-07 (×2): 40 mg via ORAL
  Filled 2012-06-06 (×2): qty 1

## 2012-06-06 MED ORDER — PIPERACILLIN-TAZOBACTAM 3.375 G IVPB
3.3750 g | Freq: Three times a day (TID) | INTRAVENOUS | Status: DC
  Filled 2012-06-06 (×2): qty 50

## 2012-06-06 MED ORDER — ACETAMINOPHEN 325 MG PO TABS: 650.0000 mg | ORAL_TABLET | Freq: Four times a day (QID) | ORAL | Status: DC | PRN

## 2012-06-06 MED ORDER — HYDROMORPHONE HCL PF 1 MG/ML IJ SOLN
0.2500 mg | INTRAMUSCULAR | Status: DC | PRN
  Administered 2012-06-06 (×2): 0.5 mg via INTRAVENOUS

## 2012-06-06 MED ORDER — MECLIZINE HCL 25 MG PO TABS
25.0000 mg | ORAL_TABLET | Freq: Two times a day (BID) | ORAL | Status: DC | PRN
  Filled 2012-06-06: qty 1

## 2012-06-06 MED ORDER — MORPHINE SULFATE 2 MG/ML IJ SOLN
1.0000 mg | INTRAMUSCULAR | Status: DC | PRN
  Administered 2012-06-06: 2 mg via INTRAVENOUS
  Filled 2012-06-06: qty 2
  Filled 2012-06-06: qty 1

## 2012-06-06 MED ORDER — GLYCOPYRROLATE 0.2 MG/ML IJ SOLN
INTRAMUSCULAR | Status: DC | PRN
  Administered 2012-06-06: .6 mg via INTRAVENOUS

## 2012-06-06 MED ORDER — ACETAMINOPHEN 650 MG RE SUPP: 650.0000 mg | Freq: Four times a day (QID) | RECTAL | Status: DC | PRN

## 2012-06-06 MED ORDER — OXYCODONE-ACETAMINOPHEN 5-325 MG PO TABS
1.0000 | ORAL_TABLET | ORAL | Status: DC | PRN
  Administered 2012-06-07: 2 via ORAL
  Filled 2012-06-06: qty 2

## 2012-06-06 MED ORDER — NEOSTIGMINE METHYLSULFATE 1 MG/ML IJ SOLN
INTRAMUSCULAR | Status: DC | PRN
  Administered 2012-06-06: 5 mg via INTRAVENOUS

## 2012-06-06 MED ORDER — HYDROXYZINE HCL 25 MG PO TABS
25.0000 mg | ORAL_TABLET | Freq: Four times a day (QID) | ORAL | Status: DC | PRN
  Filled 2012-06-06: qty 1

## 2012-06-06 MED ORDER — MORPHINE SULFATE 10 MG/ML IJ SOLN
INTRAMUSCULAR | Status: DC | PRN
  Administered 2012-06-06: 5 mg via INTRAVENOUS

## 2012-06-06 MED ORDER — ONDANSETRON HCL 4 MG/2ML IJ SOLN: 4.0000 mg | Freq: Once | INTRAMUSCULAR | Status: DC | PRN

## 2012-06-06 MED ORDER — FENTANYL CITRATE 0.05 MG/ML IJ SOLN
INTRAMUSCULAR | Status: DC | PRN
  Administered 2012-06-06: 150 ug via INTRAVENOUS
  Administered 2012-06-06: 100 ug via INTRAVENOUS

## 2012-06-06 MED ORDER — LACTATED RINGERS IV SOLN
INTRAVENOUS | Status: DC | PRN
  Administered 2012-06-06: 15:00:00 via INTRAVENOUS

## 2012-06-06 MED ORDER — METOCLOPRAMIDE HCL 5 MG/ML IJ SOLN
10.0000 mg | Freq: Once | INTRAMUSCULAR | Status: AC
  Administered 2012-06-06: 10 mg via INTRAVENOUS
  Filled 2012-06-06: qty 2

## 2012-06-06 MED ORDER — BUPIVACAINE-EPINEPHRINE 0.25% -1:200000 IJ SOLN
INTRAMUSCULAR | Status: DC | PRN
  Administered 2012-06-06: 10 mL

## 2012-06-06 MED ORDER — IOHEXOL 300 MG/ML  SOLN
20.0000 mL | INTRAMUSCULAR | Status: AC
  Administered 2012-06-06 (×2): 20 mL via ORAL

## 2012-06-06 SURGICAL SUPPLY — 46 items
ADH SKN CLS APL DERMABOND .7 (GAUZE/BANDAGES/DRESSINGS) ×1
APPLIER CLIP ROT 10 11.4 M/L (STAPLE)
APR CLP MED LRG 11.4X10 (STAPLE)
BAG SPEC RTRVL LRG 6X4 10 (ENDOMECHANICALS) ×1
BLADE SURG ROTATE 9660 (MISCELLANEOUS) ×1 IMPLANT
CANISTER SUCTION 2500CC (MISCELLANEOUS) ×2 IMPLANT
CHLORAPREP W/TINT 26ML (MISCELLANEOUS) ×2 IMPLANT
CLIP APPLIE ROT 10 11.4 M/L (STAPLE) IMPLANT
CLOTH BEACON ORANGE TIMEOUT ST (SAFETY) ×2 IMPLANT
COVER SURGICAL LIGHT HANDLE (MISCELLANEOUS) ×2 IMPLANT
CUTTER LINEAR ENDO 35 ETS (STAPLE) IMPLANT
CUTTER LINEAR ENDO 35 ETS TH (STAPLE) ×1 IMPLANT
DECANTER SPIKE VIAL GLASS SM (MISCELLANEOUS) ×2 IMPLANT
DERMABOND ADVANCED (GAUZE/BANDAGES/DRESSINGS) ×1
DERMABOND ADVANCED .7 DNX12 (GAUZE/BANDAGES/DRESSINGS) ×1 IMPLANT
DRAPE UTILITY 15X26 W/TAPE STR (DRAPE) ×4 IMPLANT
ELECT REM PT RETURN 9FT ADLT (ELECTROSURGICAL) ×2
ELECTRODE REM PT RTRN 9FT ADLT (ELECTROSURGICAL) ×1 IMPLANT
ENDOLOOP SUT PDS II  0 18 (SUTURE)
ENDOLOOP SUT PDS II 0 18 (SUTURE) IMPLANT
GLOVE BIO SURGEON STRL SZ8 (GLOVE) ×2 IMPLANT
GLOVE BIOGEL PI IND STRL 8 (GLOVE) ×1 IMPLANT
GLOVE BIOGEL PI INDICATOR 8 (GLOVE) ×1
GOWN PREVENTION PLUS XLARGE (GOWN DISPOSABLE) ×2 IMPLANT
GOWN STRL NON-REIN LRG LVL3 (GOWN DISPOSABLE) ×4 IMPLANT
KIT BASIN OR (CUSTOM PROCEDURE TRAY) ×2 IMPLANT
KIT ROOM TURNOVER OR (KITS) ×2 IMPLANT
NS IRRIG 1000ML POUR BTL (IV SOLUTION) ×2 IMPLANT
PAD ARMBOARD 7.5X6 YLW CONV (MISCELLANEOUS) ×4 IMPLANT
POUCH SPECIMEN RETRIEVAL 10MM (ENDOMECHANICALS) ×2 IMPLANT
RELOAD /EVU35 (ENDOMECHANICALS) IMPLANT
RELOAD CUTTER ETS 35MM STAND (ENDOMECHANICALS) IMPLANT
SCALPEL HARMONIC ACE (MISCELLANEOUS) ×2 IMPLANT
SET IRRIG TUBING LAPAROSCOPIC (IRRIGATION / IRRIGATOR) ×2 IMPLANT
SPECIMEN JAR SMALL (MISCELLANEOUS) ×2 IMPLANT
SUT VIC AB 4-0 PS2 27 (SUTURE) ×2 IMPLANT
SWAB COLLECTION DEVICE MRSA (MISCELLANEOUS) IMPLANT
TOWEL OR 17X24 6PK STRL BLUE (TOWEL DISPOSABLE) ×2 IMPLANT
TOWEL OR 17X26 10 PK STRL BLUE (TOWEL DISPOSABLE) ×2 IMPLANT
TRAY FOLEY CATH 14FR (SET/KITS/TRAYS/PACK) ×2 IMPLANT
TRAY LAPAROSCOPIC (CUSTOM PROCEDURE TRAY) ×2 IMPLANT
TROCAR HASSON GELL 12X100 (TROCAR) ×2 IMPLANT
TROCAR Z-THREAD FIOS 12X100MM (TROCAR) ×2 IMPLANT
TROCAR Z-THREAD FIOS 5X100MM (TROCAR) ×2 IMPLANT
TUBE ANAEROBIC SPECIMEN COL (MISCELLANEOUS) IMPLANT
WATER STERILE IRR 1000ML POUR (IV SOLUTION) ×2 IMPLANT

## 2012-06-06 NOTE — Preoperative (Signed)
Beta Blockers   Reason not to administer Beta Blockers:Not Applicable 

## 2012-06-06 NOTE — ED Notes (Signed)
Pt ambulated to restroom independently.  Pt states pain in abdomin is generalized but has eased "a little". Pt alert oriented X4 family at bedside

## 2012-06-06 NOTE — Op Note (Signed)
06/06/2012  3:29 PM  PATIENT:  Frank Wilcox  31 y.o. male  PRE-OPERATIVE DIAGNOSIS:  appendicitis  POST-OPERATIVE DIAGNOSIS:  appendicitis  PROCEDURE:  Procedure(s): APPENDECTOMY LAPAROSCOPIC  SURGEON:  Surgeon(s): Liz Malady, MD  PHYSICIAN ASSISTANT:   ASSISTANTS: none   ANESTHESIA:   local and general  EBL:  Total I/O In: 500 [I.V.:500] Out: -   BLOOD ADMINISTERED:none  DRAINS: none   SPECIMEN:  Excision  DISPOSITION OF SPECIMEN:  PATHOLOGY  COUNTS:  YES  DICTATION: .Dragon DictationPatient presented to the emergency department today with appendicitis. He is brought for emergency appendectomy. Informed consent was obtained by myself in Spanish. The patient was identified in the preop holding area. He completed IV antibiotics. He was brought to the operating room and general endotracheal anesthesia was administered by the anesthesia staff. Foley catheter was placed by nursing. Abdomen was prepped and draped in sterile fashion. Time out procedure was done. Infraumbilical region was infiltrated with quarter percent Marcaine with epinephrine. Infraumbilical incision was made. Subcutaneous tissues were dissected down revealing the anterior fascia. This was divided along the midline. Peritoneal cavity was entered under direct vision. 0 Vicryl pursestring suture was placed on the fascial opening. Hassan trocar was inserted. Abdomen was insufflated with carbon dioxide in standard fashion. Under direct vision a 12 mm left lower quadrant a 5 mm right mid abdomen port were placed. Local was used at each port site. Laparoscopic exploration but very inflamed but not perforated appendix. The mesoappendix was divided with harmonic scalpel achieving excellent hemostasis. This cleared off the base. The base was intact. The base the appendix was then divided with Endo GIA with vascular load. There was excellent staple line. Appendix was placed in an Endo Catch bag and removed from  the abdomen via the left lower quadrant port site. Abdomen was copiously irrigated. There was excellent hemostasis. Staple line remained intact. Ports were removed under direct vision. Pneumoperitoneum was released. 0 Vicryl pursestring was tied to close the fascia infraumbilically. All 3 wounds were copiously irrigated and the skin of each was closed with running 4 Vicryl subcuticular stitch followed by Dermabond. All counts were correct. Patient tolerated procedure well without apparent complication was taken recovery in stable condition.  PATIENT DISPOSITION:  PACU - hemodynamically stable.   Delay start of Pharmacological VTE agent (>24hrs) due to surgical blood loss or risk of bleeding:  no  Violeta Gelinas, MD, MPH, FACS Pager: [phone]  8/23/20133:29 PM

## 2012-06-06 NOTE — ED Notes (Signed)
Pt reports generalized abd pain x4 hrs - pt denies any n/v/d, fever, or urinary symptoms. Pt in no acute distress on assessment, lying comfortably on bed. Pt is A&Ox4.

## 2012-06-06 NOTE — Progress Notes (Signed)
Interpreter Wyvonnia Dusky For RN patients room

## 2012-06-06 NOTE — ED Notes (Signed)
Family at bedside. 

## 2012-06-06 NOTE — Progress Notes (Signed)
Interpreter Wyvonnia Dusky PACU Postsurgery

## 2012-06-06 NOTE — H&P (Signed)
Patient examined and I spoke to him at length is [place].  Plan laparoscopic appendectomy.  Procedure, risks, and benefits were discussed.  I answered his questions.  He is agreeable. Patient examined and I agree with the assessment and plan  Violeta Gelinas, MD, MPH, FACS Pager: [phone]  06/06/2012 12:49 PM

## 2012-06-06 NOTE — Anesthesia Postprocedure Evaluation (Signed)
Anesthesia Post Note  Patient: Frank Wilcox  Procedure(s) Performed: Procedure(s) (LRB): APPENDECTOMY LAPAROSCOPIC (N/A)  Anesthesia type: general  Patient location: PACU  Post pain: Pain level controlled  Post assessment: Patient's Cardiovascular Status Stable  Last Vitals:  Filed Vitals:   06/06/12 1540  BP:   Pulse:   Temp: 36.6 C  Resp:     Post vital signs: Reviewed and stable  Level of consciousness: sedated  Complications: No apparent anesthesia complications

## 2012-06-06 NOTE — ED Notes (Signed)
Surgeon at bedside.  Pt wife given [place] sandwich and crackers.

## 2012-06-06 NOTE — ED Provider Notes (Signed)
Patient speaks limited [place]. He reports he started getting lower abdominal pain on August 22. He denies nausea vomiting or diarrhea.  On exam patient has diffuse abdominal pain that seems most tender in the lower abdomen.  Medical screening examination/treatment/procedure(s) were conducted as a shared visit with non-physician practitioner(s) and myself.  I personally evaluated the patient during the encounter  Devoria Albe, MD, Franz Dell, MD 06/06/12 [phone]

## 2012-06-06 NOTE — H&P (Signed)
Frank Wilcox is an 31 y.o. male.   Chief Complaint: abdominal pain HPI: A 31 year old Hispanic gentleman. He speaks only [place]. He reports intermittent problems with abdominal pain, and about 4 days ago. Symptoms got better but have recurred he presented to the emergency room today. Prior history of gastritis and underwent EGD 03//2013 by Dr. Rhea Belton. This showed mild chronic gastritis, no H. pylori no dysplasia no malignancy. He has a history of alcohol use, this has decreased since his evaluation. He still has problems with GERD symptoms, he is currently taking no medications. He'll also concerned about being out of work, he says he has no money, his wife is pregnant. Currently he continues to have pain in both the right and left lower quadrant it is tender with palpation,, movement, deep inspiration and some nausea, no vomiting. Workup in the emergency room shows a WBC of 14,700. LFTs and lipase are normal. CT scan shows an excessive thickened with inflammatory changes consistent with acute appendicitis the adjacent inflamed lymph nodes. There is also mild inflammation of the terminal ileum. A small amount of free pelvic fluid. Exam and history were obtained using interpreter phone system. Were asked to see in consultation.   Past Medical History  Diagnosis Date  . Unspecified gastritis and gastroduodenitis without mention of hemorrhage   . Esophageal reflux, still has symptoms   . Alcohol abuse, down to about 12 beers per week   . Anxiety   . Sleep disturbance, unspecified     Past Surgical History  Procedure Date  . Ankle surgery     right    Family History  Problem Relation Age of Onset  . Diabetes Mother    Social History:  reports that he has quit smoking. He has never used smokeless tobacco. He reports that he drinks alcohol. He reports that he does not use illicit drugs.  Allergies: No Known Allergies Prior to Admission medications   Medication Sig Start Date End  Date Taking? Authorizing Provider  hydrOXYzine (ATARAX/VISTARIL) 25 MG tablet Take 1 every 6 hours as needed for anxiety. 04/09/12  no Frank Likes, MD  meclizine (ANTIVERT) 25 MG tablet Take 25 mg by mouth 2 (two) times daily as needed. For dizziness   no Historical Provider, MD  omeprazole (PRILOSEC) 20 MG capsule Take 20 mg by mouth daily.   no Historical Provider, MD      (Not in a hospital admission)  Results for orders placed during the hospital encounter of 06/06/12 (from the past 48 hour(s))  CBC WITH DIFFERENTIAL     Status: Abnormal   Collection Time   06/06/12  3:36 AM      Component Value Range Comment   WBC 14.7 (*) 4.0 - 10.5 K/uL    RBC 5.06  4.22 - 5.81 MIL/uL    Hemoglobin 16.0  13.0 - 17.0 g/dL    HCT 09.8  11.9 - 14.7 %    MCV 92.1  78.0 - 100.0 fL    MCH 31.6  26.0 - 34.0 pg    MCHC 34.3  30.0 - 36.0 g/dL    RDW 82.9  56.2 - 13.0 %    Platelets 200  150 - 400 K/uL    Neutrophils Relative 79 (*) 43 - 77 %    Neutro Abs 11.7 (*) 1.7 - 7.7 K/uL    Lymphocytes Relative 15  12 - 46 %    Lymphs Abs 2.2  0.7 - 4.0 K/uL    Monocytes Relative 5  3 - 12 %    Monocytes Absolute 0.7  0.1 - 1.0 K/uL    Eosinophils Relative 1  0 - 5 %    Eosinophils Absolute 0.1  0.0 - 0.7 K/uL    Basophils Relative 0  0 - 1 %    Basophils Absolute 0.0  0.0 - 0.1 K/uL   COMPREHENSIVE METABOLIC PANEL     Status: Abnormal   Collection Time   06/06/12  3:36 AM      Component Value Range Comment   Sodium 139  135 - 145 mEq/L    Potassium 4.4  3.5 - 5.1 mEq/L    Chloride 103  96 - 112 mEq/L    CO2 27  19 - 32 mEq/L    Glucose, Bld 101 (*) 70 - 99 mg/dL    BUN 17  6 - 23 mg/dL    Creatinine, Ser 9.62  0.50 - 1.35 mg/dL    Calcium 9.7  8.4 - 95.2 mg/dL    Total Protein 7.7  6.0 - 8.3 g/dL    Albumin 4.4  3.5 - 5.2 g/dL    AST 21  0 - 37 U/L    ALT 19  0 - 53 U/L    Alkaline Phosphatase 63  39 - 117 U/L    Total Bilirubin 0.5  0.3 - 1.2 mg/dL    GFR calc non Af Amer >90  >90 mL/min      GFR calc Af Amer >90  >90 mL/min   LIPASE, BLOOD     Status: Normal   Collection Time   06/06/12  5:59 AM      Component Value Range Comment   Lipase 43  11 - 59 U/L   URINALYSIS, ROUTINE W REFLEX MICROSCOPIC     Status: Normal   Collection Time   06/06/12  6:44 AM      Component Value Range Comment   Color, Urine YELLOW  YELLOW    APPearance CLEAR  CLEAR    Specific Gravity, Urine 1.023  1.005 - 1.030    pH 5.5  5.0 - 8.0    Glucose, UA NEGATIVE  NEGATIVE mg/dL    Hgb urine dipstick NEGATIVE  NEGATIVE    Bilirubin Urine NEGATIVE  NEGATIVE    Ketones, ur NEGATIVE  NEGATIVE mg/dL    Protein, ur NEGATIVE  NEGATIVE mg/dL    Urobilinogen, UA 0.2  0.0 - 1.0 mg/dL    Nitrite NEGATIVE  NEGATIVE    Leukocytes, UA NEGATIVE  NEGATIVE MICROSCOPIC NOT DONE ON URINES WITH NEGATIVE PROTEIN, BLOOD, LEUKOCYTES, NITRITE, OR GLUCOSE <1000 mg/dL.   Ct Abdomen Pelvis W Contrast  06/06/2012  *RADIOLOGY REPORT*  Clinical Data: Abdominal pain.  CT ABDOMEN AND PELVIS WITH CONTRAST  Technique:  Multidetector CT imaging of the abdomen and pelvis was performed following the standard protocol during bolus administration of intravenous contrast.  Contrast: OMNIPAQUE IOHEXOL 300 MG/ML  SOLN, 1 OMNIPAQUE IOHEXOL 300 MG/ML  SOLN  Comparison: None  Findings: The lung bases are clear.  No pleural effusion.  The solid abdominal organs are normal.  Mild diffuse fatty infiltration of the liver is noted.  The gallbladder is normal.  No common bile duct dilatation.  The stomach, duodenum, small bowel and colon are unremarkable.  The appendix is thickened and demonstrates inflammatory change consistent with early acute appendicitis.  Adjacent inflamed lymph nodes are noted.  Mild associated inflammation of the terminal ileum.  There is a tiny amount of free pelvic  fluid.  The aorta is normal.  The major branch vessels are normal.  The bladder, prostate gland and seminal vesicles are unremarkable. No inguinal mass or hernia.   The bony structures are unremarkable.  IMPRESSION: Early acute appendicitis.   Original Report Authenticated By: P. Loralie Champagne, M.D.     Review of Systems  Constitutional: Negative for fever, chills, weight loss and malaise/fatigue.  HENT: Negative.   Eyes: Negative.   Respiratory: Negative.   Cardiovascular: Negative.   Gastrointestinal: Positive for heartburn, nausea, abdominal pain (he point to both the left and right side.  no difference in pain.) and constipation (occasional). Negative for vomiting, diarrhea, blood in stool and melena.  Genitourinary: Negative.   Musculoskeletal: Positive for joint pain (he has some pain left shoulder in the past.  none for 2 weeks).  Skin: Negative.   Neurological: Negative.   Endo/Heme/Allergies: Negative.   Psychiatric/Behavioral: The patient is nervous/anxious.     Blood pressure 124/72, pulse 88, temperature 98.3 F (36.8 C), temperature source Oral, resp. rate 18, SpO2 99.00%. Physical Exam  Constitutional: He is oriented to person, place, and time. He appears well-developed and well-nourished. No distress.  HENT:  Head: Normocephalic and atraumatic.  Nose: Nose normal.  Eyes: Conjunctivae and EOM are normal. Pupils are equal, round, and reactive to light. Right eye exhibits no discharge. Left eye exhibits no discharge. No scleral icterus.  Neck: Normal range of motion. Neck supple. No JVD present. No tracheal deviation present. No thyromegaly present.  Cardiovascular: Normal rate, regular rhythm, normal heart sounds and intact distal pulses.  Exam reveals no gallop.   No murmur heard. Respiratory: Effort normal and breath sounds normal. No stridor. No respiratory distress. He has no wheezes. He has no rales. He exhibits no tenderness.  GI: Soft. Bowel sounds are normal. He exhibits no distension and no mass. There is tenderness (hurts to breath deep and move.  pain on left and right, he does not see a big difference.). There is no  rebound and no guarding.  Musculoskeletal: Normal range of motion. He exhibits no edema.  Lymphadenopathy:    He has no cervical adenopathy.  Neurological: He is alert and oriented to person, place, and time. No cranial nerve deficit.  Skin: Skin is warm and dry. He is not diaphoretic. No erythema.       tatoo right arm  Psychiatric: He has a normal mood and affect. His behavior is normal. Judgment and thought content normal.     Assessment/Plan 1. Acute appendicitis 2. History of GERD/gastritis with EGD 12/2011 3. History of alcohol use 4. History of anxiety 5.BMI 30  Plan:  Start antibiotics, last PO was last night, hydrate, plan surgery after he's seen by Dr. Williemae Natter physician assistant for Dr. Violeta Gelinas.  Frank Wilcox 06/06/2012, 11:15 AM

## 2012-06-06 NOTE — Anesthesia Preprocedure Evaluation (Signed)
Anesthesia Evaluation  Patient identified by MRN, date of birth, ID band Patient awake    Reviewed: Allergy & Precautions, H&P , NPO status , Patient's Chart, lab work & pertinent test results  Airway Mallampati: I TM Distance: >3 FB Neck ROM: full    Dental   Pulmonary          Cardiovascular Rhythm:regular Rate:Normal     Neuro/Psych    GI/Hepatic GERD-  ,  Endo/Other    Renal/GU      Musculoskeletal   Abdominal   Peds  Hematology   Anesthesia Other Findings ETOH abuse  Reproductive/Obstetrics                           Anesthesia Physical Anesthesia Plan  ASA: I  Anesthesia Plan: General   Post-op Pain Management:    Induction: Intravenous  Airway Management Planned: Oral ETT  Additional Equipment:   Intra-op Plan:   Post-operative Plan: Extubation in OR  Informed Consent: I have reviewed the patients History and Physical, chart, labs and discussed the procedure including the risks, benefits and alternatives for the proposed anesthesia with the patient or authorized representative who has indicated his/her understanding and acceptance.     Plan Discussed with: CRNA, Anesthesiologist and Surgeon  Anesthesia Plan Comments:         Anesthesia Quick Evaluation

## 2012-06-06 NOTE — ED Notes (Signed)
Patient complaining of abdominal pain that started three hours ago; denies nausea, vomiting, diarrhea, chest pain, and shortness of breath.

## 2012-06-06 NOTE — Progress Notes (Signed)
31 yo Hispanic male who speaks Spanish, has come in with abdominal pain. Exam shows RLQ tenderness.  WBC is appx 14,000 and CT of the abdomen/pelvis shows early acute appendicitis.  Advised pt and his wife of the diagnosis via [employer].  Call to General Surgery to admit him.

## 2012-06-06 NOTE — ED Notes (Signed)
Or is ready for pt. 

## 2012-06-06 NOTE — ED Provider Notes (Signed)
History     CSN: [id number]  Arrival date & time 06/06/12  9629   First MD Initiated Contact with Patient 06/06/12 0536      Chief Complaint  Patient presents with  . Abdominal Pain    (Consider location/radiation/quality/duration/timing/severity/associated sxs/prior treatment) Patient is a 31 y.o. male presenting with abdominal pain. The history is provided by the patient.  Abdominal Pain The primary symptoms of the illness include abdominal pain. The primary symptoms of the illness do not include fever, shortness of breath, nausea, vomiting, diarrhea or dysuria. The current episode started 3 to 5 hours ago.  Symptoms associated with the illness do not include hematuria. Associated symptoms comments: Lower abdominal pain that started last night after a completely normal day yesterday. Pain is located in the suprapubic and RLQ, less in the LLQ, areas. He denies change of appetite, N, V, fever or D. He reports chronic epigastric pain diagnosed as "gastritis" and treated with omeprazole that he has been out of for the past 15 days. He denies epigastric pain with current symptoms..    Past Medical History  Diagnosis Date  . Unspecified gastritis and gastroduodenitis without mention of hemorrhage   . Esophageal reflux   . Alcohol abuse   . Anxiety   . Sleep disturbance, unspecified     Past Surgical History  Procedure Date  . Ankle surgery     right    Family History  Problem Relation Age of Onset  . Diabetes Mother     History  Substance Use Topics  . Smoking status: Former Games developer  . Smokeless tobacco: Never Used  . Alcohol Use: 0.0 oz/week     occasional      Review of Systems  Constitutional: Negative for fever.  Respiratory: Negative for shortness of breath.   Cardiovascular: Negative for chest pain.  Gastrointestinal: Positive for abdominal pain. Negative for nausea, vomiting and diarrhea.  Genitourinary: Negative for dysuria, hematuria and discharge.     Allergies  Review of patient's allergies indicates no known allergies.  Home Medications   Current Outpatient Rx  Name Route Sig Dispense Refill  . HYDROXYZINE HCL 25 MG PO TABS  Take 1 every 6 hours as needed for anxiety. 30 tablet 0  . MECLIZINE HCL 25 MG PO TABS Oral Take 25 mg by mouth 2 (two) times daily as needed. For dizziness    . OMEPRAZOLE 20 MG PO CPDR Oral Take 20 mg by mouth daily.      BP 124/72  Pulse 88  Temp 98.3 F (36.8 C) (Oral)  Resp 18  SpO2 99%  Physical Exam  Constitutional: He is oriented to person, place, and time. He appears well-developed and well-nourished. No distress.  Cardiovascular: Normal rate and regular rhythm.   No murmur heard. Pulmonary/Chest: Effort normal. He has no wheezes. He has no rales.  Abdominal: Soft. Bowel sounds are normal. He exhibits no distension. There is tenderness. There is no rebound and no guarding.       Tenderness to palpation of suprapubic area and RLQ. No rebound or guarding.   Neurological: He is alert and oriented to person, place, and time.  Skin: Skin is warm and dry.  Psychiatric: He has a normal mood and affect.    ED Course  Procedures (including critical care time)  Labs Reviewed  CBC WITH DIFFERENTIAL - Abnormal; Notable for the following:    WBC 14.7 (*)     Neutrophils Relative 79 (*)     Neutro Abs 11.7 (*)  All other components within normal limits  COMPREHENSIVE METABOLIC PANEL - Abnormal; Notable for the following:    Glucose, Bld 101 (*)     All other components within normal limits  URINALYSIS, ROUTINE W REFLEX MICROSCOPIC  LIPASE, BLOOD   Results for orders placed during the hospital encounter of 06/06/12  URINALYSIS, ROUTINE W REFLEX MICROSCOPIC      Component Value Range   Color, Urine YELLOW  YELLOW   APPearance CLEAR  CLEAR   Specific Gravity, Urine 1.023  1.005 - 1.030   pH 5.5  5.0 - 8.0   Glucose, UA NEGATIVE  NEGATIVE mg/dL   Hgb urine dipstick NEGATIVE  NEGATIVE    Bilirubin Urine NEGATIVE  NEGATIVE   Ketones, ur NEGATIVE  NEGATIVE mg/dL   Protein, ur NEGATIVE  NEGATIVE mg/dL   Urobilinogen, UA 0.2  0.0 - 1.0 mg/dL   Nitrite NEGATIVE  NEGATIVE   Leukocytes, UA NEGATIVE  NEGATIVE  CBC WITH DIFFERENTIAL      Component Value Range   WBC 14.7 (*) 4.0 - 10.5 K/uL   RBC 5.06  4.22 - 5.81 MIL/uL   Hemoglobin 16.0  13.0 - 17.0 g/dL   HCT 16.1  09.6 - 04.5 %   MCV 92.1  78.0 - 100.0 fL   MCH 31.6  26.0 - 34.0 pg   MCHC 34.3  30.0 - 36.0 g/dL   RDW 40.9  81.1 - 91.4 %   Platelets 200  150 - 400 K/uL   Neutrophils Relative 79 (*) 43 - 77 %   Neutro Abs 11.7 (*) 1.7 - 7.7 K/uL   Lymphocytes Relative 15  12 - 46 %   Lymphs Abs 2.2  0.7 - 4.0 K/uL   Monocytes Relative 5  3 - 12 %   Monocytes Absolute 0.7  0.1 - 1.0 K/uL   Eosinophils Relative 1  0 - 5 %   Eosinophils Absolute 0.1  0.0 - 0.7 K/uL   Basophils Relative 0  0 - 1 %   Basophils Absolute 0.0  0.0 - 0.1 K/uL  COMPREHENSIVE METABOLIC PANEL      Component Value Range   Sodium 139  135 - 145 mEq/L   Potassium 4.4  3.5 - 5.1 mEq/L   Chloride 103  96 - 112 mEq/L   CO2 27  19 - 32 mEq/L   Glucose, Bld 101 (*) 70 - 99 mg/dL   BUN 17  6 - 23 mg/dL   Creatinine, Ser 7.82  0.50 - 1.35 mg/dL   Calcium 9.7  8.4 - 95.6 mg/dL   Total Protein 7.7  6.0 - 8.3 g/dL   Albumin 4.4  3.5 - 5.2 g/dL   AST 21  0 - 37 U/L   ALT 19  0 - 53 U/L   Alkaline Phosphatase 63  39 - 117 U/L   Total Bilirubin 0.5  0.3 - 1.2 mg/dL   GFR calc non Af Amer >90  >90 mL/min   GFR calc Af Amer >90  >90 mL/min  LIPASE, BLOOD      Component Value Range   Lipase 43  11 - 59 U/L   Ct Abdomen Pelvis W Contrast  06/06/2012  *RADIOLOGY REPORT*  Clinical Data: Abdominal pain.  CT ABDOMEN AND PELVIS WITH CONTRAST  Technique:  Multidetector CT imaging of the abdomen and pelvis was performed following the standard protocol during bolus administration of intravenous contrast.  Contrast: OMNIPAQUE IOHEXOL 300 MG/ML  SOLN, 1  OMNIPAQUE IOHEXOL 300  MG/ML  SOLN  Comparison: None  Findings: The lung bases are clear.  No pleural effusion.  The solid abdominal organs are normal.  Mild diffuse fatty infiltration of the liver is noted.  The gallbladder is normal.  No common bile duct dilatation.  The stomach, duodenum, small bowel and colon are unremarkable.  The appendix is thickened and demonstrates inflammatory change consistent with early acute appendicitis.  Adjacent inflamed lymph nodes are noted.  Mild associated inflammation of the terminal ileum.  There is a tiny amount of free pelvic fluid.  The aorta is normal.  The major branch vessels are normal.  The bladder, prostate gland and seminal vesicles are unremarkable. No inguinal mass or hernia.  The bony structures are unremarkable.  IMPRESSION: Early acute appendicitis.   Original Report Authenticated By: P. Loralie Champagne, M.D.    No results found.   No diagnosis found. 1. Acute appendicitis    MDM  Discussed CT findings of appendicitis with the patient via the interpreter phones. All questions answered. Surgery consulted. Patient stable. Admit.        Rodena Medin, PA-C 06/06/12 1030

## 2012-06-06 NOTE — Transfer of Care (Signed)
Immediate Anesthesia Transfer of Care Note  Patient: Frank Wilcox  Procedure(s) Performed: Procedure(s) (LRB): APPENDECTOMY LAPAROSCOPIC (N/A)  Patient Location: PACU  Anesthesia Type: General  Level of Consciousness: sedated  Airway & Oxygen Therapy: Patient Spontanous Breathing and Patient connected to face mask oxygen  Post-op Assessment: Report given to PACU RN and Post -op Vital signs reviewed and stable  Post vital signs: Reviewed and stable  Complications: No apparent anesthesia complications

## 2012-06-06 NOTE — Progress Notes (Signed)
Interpreter Olar Santini Namihira for pre surgery   °

## 2012-06-07 MED ORDER — OXYCODONE-ACETAMINOPHEN 5-325 MG PO TABS: 1.0000 | ORAL_TABLET | ORAL | Status: AC | PRN

## 2012-06-07 NOTE — ED Provider Notes (Signed)
See prior note   Ward Givens, MD 06/07/12 [phone]

## 2012-06-07 NOTE — Discharge Summary (Signed)
Physician Discharge Summary  Patient ID: Frank Wilcox MRN: [id number] DOB/AGE: 13-Nov-1980 31 y.o.  Admit date: 06/06/2012 Discharge date: 06/07/2012  Admission Diagnoses: Acute appendicitis [540.9] Abd pain Patient Active Problem List  Diagnosis  . Dyspepsia  acute appendicitis  Discharge Diagnoses: same Active Problems:  * No active hospital problems. *    Discharged Condition: good  Hospital Course: Unremarkable.  Vital normal and tolerating diet with adequate pain control.   Consults: None  Significant Diagnostic Studies: CT abdomen   Treatments: surgery: laparoscopic appendectomy  Discharge Exam: Blood pressure 109/64, pulse 74, temperature 99.5 F (37.5 C), temperature source Oral, resp. rate 14, SpO2 98.00%. Incision/Wound:clean dry intact.  Soft min tender lower abdomen.  No peritonitis Pulm:  CTA CV:  rrr  Disposition: 01-Home or Self Care  Discharge Orders    Future Orders Please Complete By Expires   Diet - low sodium heart healthy      Increase activity slowly      Discharge instructions      Comments:   Shower today.  Drive in 2 days.  No lifting more than 20 lbs for 10 days     Medication List  As of 06/07/2012 11:31 AM   STOP taking these medications         omeprazole 20 MG capsule         TAKE these medications         hydrOXYzine 25 MG tablet   Commonly known as: ATARAX/VISTARIL   Take 1 every 6 hours as needed for anxiety.      meclizine 25 MG tablet   Commonly known as: ANTIVERT   Take 25 mg by mouth 2 (two) times daily as needed. For dizziness      oxyCODONE-acetaminophen 5-325 MG per tablet   Commonly known as: PERCOCET/ROXICET   Take 1-2 tablets by mouth every 4 (four) hours as needed (pain).             Signed: Avleen Bordwell A. 06/07/2012, 11:31 AM

## 2012-06-07 NOTE — Progress Notes (Signed)
[employer] called to go over pt's discharge instructions with him and his family member.  Discussed f/u appt, prescriptions, activity restrictions and diet.  Pt verbalized understanding per the interpreter.

## 2012-06-10 ENCOUNTER — Encounter (HOSPITAL_COMMUNITY): Payer: Self-pay | Admitting: General Surgery

## 2012-06-12 ENCOUNTER — Encounter (HOSPITAL_COMMUNITY): Payer: Self-pay | Admitting: Emergency Medicine

## 2012-06-12 ENCOUNTER — Emergency Department (INDEPENDENT_AMBULATORY_CARE_PROVIDER_SITE_OTHER)
Admission: EM | Admit: 2012-06-12 | Discharge: 2012-06-12 | Disposition: A | Discharge status: Home / Self Care | Payer: Self-pay | Source: Home / Self Care | Attending: Family Medicine | Admitting: Family Medicine

## 2012-06-12 DIAGNOSIS — Z87898 Personal history of other specified conditions: Secondary | ICD-10-CM

## 2012-06-12 MED ORDER — MUPIROCIN CALCIUM 2 % EX CREA: TOPICAL_CREAM | Freq: Three times a day (TID) | CUTANEOUS | Status: AC

## 2012-06-12 NOTE — ED Notes (Signed)
Requesting wound to be checked .  Patient concerned for drainage.  Dr Artis Flock at bedside

## 2012-06-12 NOTE — ED Provider Notes (Signed)
History     CSN: [id number]  Arrival date & time 06/12/12  1720   First MD Initiated Contact with Patient 06/12/12 1734      Chief Complaint  Patient presents with  . Wound Check    (Consider location/radiation/quality/duration/timing/severity/associated sxs/prior treatment) Patient is a 31 y.o. male presenting with wound check. The history is provided by the patient.  Wound Check  He was treated in the ED 5 to 10 days ago (s/p lap appy on 8/23, concerned about possible wound infection). Treatments tried: no fever, eating well, nl bm. There has been clear discharge from the wound. There is no redness present. There is no swelling present. The pain has no pain.    Past Medical History  Diagnosis Date  . Unspecified gastritis and gastroduodenitis without mention of hemorrhage   . Esophageal reflux   . Alcohol abuse   . Anxiety   . Sleep disturbance, unspecified     Past Surgical History  Procedure Date  . Ankle surgery     right  . Laparoscopic appendectomy 06/06/2012    Procedure: APPENDECTOMY LAPAROSCOPIC;  Surgeon: Liz Malady, MD;  Location: [facility] OR;  Service: General;  Laterality: N/A;    Family History  Problem Relation Age of Onset  . Diabetes Mother     History  Substance Use Topics  . Smoking status: Former Games developer  . Smokeless tobacco: Never Used  . Alcohol Use: 0.0 oz/week     occasional      Review of Systems  Constitutional: Negative.  Negative for fever, chills and appetite change.  Gastrointestinal: Negative for nausea, vomiting and diarrhea.  Skin: Positive for wound. Negative for rash.    Allergies  Review of patient's allergies indicates no known allergies.  Home Medications   Current Outpatient Rx  Name Route Sig Dispense Refill  . HYDROXYZINE HCL 25 MG PO TABS  Take 1 every 6 hours as needed for anxiety. 30 tablet 0  . MECLIZINE HCL 25 MG PO TABS Oral Take 25 mg by mouth 2 (two) times daily as needed. For dizziness    . MUPIROCIN  CALCIUM 2 % EX CREA Topical Apply topically 3 (three) times daily. 15 g 0  . OXYCODONE-ACETAMINOPHEN 5-325 MG PO TABS Oral Take 1-2 tablets by mouth every 4 (four) hours as needed (pain). 30 tablet 0  . OXYCODONE-ACETAMINOPHEN 5-325 MG PO TABS Oral Take 1 tablet by mouth every 4 (four) hours as needed for pain. 30 tablet 0    BP 110/63  Pulse 74  Temp 98.7 F (37.1 C) (Oral)  Resp 17  SpO2 100%  Physical Exam  Nursing note and vitals reviewed. Constitutional: He appears well-developed and well-nourished.  Abdominal: Soft. Bowel sounds are normal. He exhibits no distension and no mass. There is no tenderness. There is no rebound and no guarding.  Skin: Skin is warm and dry. No erythema.       No signs of infection, dermabond covering sites. No drainage expressed.    ED Course  Procedures (including critical care time)  Labs Reviewed - No data to display No results found.   1. No post-op complications       MDM          Linna Hoff, MD 06/12/12 1810

## 2012-06-25 ENCOUNTER — Encounter (INDEPENDENT_AMBULATORY_CARE_PROVIDER_SITE_OTHER): Payer: Self-pay | Admitting: General Surgery

## 2012-06-25 ENCOUNTER — Ambulatory Visit (INDEPENDENT_AMBULATORY_CARE_PROVIDER_SITE_OTHER): Payer: PRIVATE HEALTH INSURANCE | Admitting: General Surgery

## 2012-06-25 VITALS — BP 120/70 | HR 76 | Temp 97.0°F | Resp 16 | Ht 69.0 in | Wt 210.5 lb

## 2012-06-25 DIAGNOSIS — Z9049 Acquired absence of other specified parts of digestive tract: Secondary | ICD-10-CM

## 2012-06-25 DIAGNOSIS — Z9889 Other specified postprocedural states: Secondary | ICD-10-CM

## 2012-06-25 HISTORY — DX: Acquired absence of other specified parts of digestive tract: Z90.49

## 2012-06-25 NOTE — Progress Notes (Signed)
Subjective:     Patient ID: Frank Wilcox, male   DOB: May 09, 1981, 31 y.o.   MRN: [id number]  HPI  Patient is status post arthroscopic appendectomy. He is mostly been feeling well. He is a little dizziness and dry mouth in the mornings but that has improved. He is eating and moving his bowels. Review of Systems     Objective:   Physical Exam Abdomen is soft and nontender. Small piece of Vicryl suture was removed from his umbilical incision. No signs of infection. No drainage.    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 He installs tile. Otherwise followup when necessary.

## 2012-07-15 ENCOUNTER — Encounter: Payer: Self-pay | Admitting: Sports Medicine

## 2012-07-15 ENCOUNTER — Ambulatory Visit (INDEPENDENT_AMBULATORY_CARE_PROVIDER_SITE_OTHER): Payer: Self-pay | Admitting: Sports Medicine

## 2012-07-15 VITALS — BP 116/77 | HR 75 | Temp 97.7°F | Wt 212.6 lb

## 2012-07-15 DIAGNOSIS — R1013 Epigastric pain: Secondary | ICD-10-CM

## 2012-07-15 DIAGNOSIS — E669 Obesity, unspecified: Secondary | ICD-10-CM

## 2012-07-15 DIAGNOSIS — M7710 Lateral epicondylitis, unspecified elbow: Secondary | ICD-10-CM

## 2012-07-15 DIAGNOSIS — Z0000 Encounter for general adult medical examination without abnormal findings: Secondary | ICD-10-CM

## 2012-07-15 MED ORDER — NAPROXEN SODIUM 220 MG PO TABS: 220.0000 mg | ORAL_TABLET | Freq: Two times a day (BID) | ORAL | Status: DC

## 2012-07-15 MED ORDER — OMEPRAZOLE 40 MG PO CPDR: 40.0000 mg | DELAYED_RELEASE_CAPSULE | Freq: Every day | ORAL | Status: DC

## 2012-07-15 NOTE — Patient Instructions (Addendum)
Follow up in 2-3 weeks to further discuss your other medical needs. I am sending in a rx for Omeprazole.  Remember to do your exercises 3 times a day 15 at a time.  Use a hammer or other light weight   Here are some basic nutrition rules to remember:  "Eat Real Foods & Drink Real Drinks" - if you think it was made in a factory . . it is likely best to avoid it as a staple in your diet.  Limiting these types of foods to 1-2 times per week is a good idea.  Sticking with fresh fruits and vegetables as well as home cooked meals will typically provide more nutrition and less salt than prepackaged meals.     Limit the amount of sugar sweetened and artificially sweetened foods and beverages.  Sticking with water flavored with a slice of lemon, lime or orange is a great option if you want something with flavor in it.  Using flavored seltzer water to flavor plain water will also add some bite if you want something more than flavor.    "Comer alimentos reales y [employer] real" - si usted piensa que fue hecho en una fbrica. Marland Kitchen lo ms probable es Engineer, drilling un elemento bsico en su dieta. La limitacin de este tipo de alimentos a 1-2 veces por semana es una buena idea. Siguiendo con las frutas y verduras frescas, as como comidas caseras tpicamente proporcionar ms nutrientes y menos sal que comidas pre-empacadas.  Limite la cantidad de azcar en los alimentos y bebidas endulzados y endulzados artificialmente. Siguiendo con agua aromatizada con una rodaja de limn, lima o naranja es una gran opcin si quieres algo de sabor en l. El uso de agua seltzer con sabor a sabor a agua corriente tambin aadir un poco de bocado si quieres algo ms de sabor.    Here are 2 of my favorite web sites that provide great nutrition and exercise advice.   www.eatsmartmovemore[place].com www.DisposableNylon.be   Dieta para el reflujo gastroesofgico - Adultos  (Diet for Gastroesophageal Reflux Disease, Adult)  El  reflujo (reflujo cido) ocurre cuando el cido del estmago pasa al esfago. Cuando el cido entra en contacto con el esfago, el cido provoca dolor e irritacin (inflamacin) en el esfago. Cuando el reflujo ocurre a menudo o es tan grave que causa dao en el esfago, se denomina enfermedad por reflujo gastroesofgico (ERGE). La terapia nutricional puede ayudar a Acupuncturist de la [place].  ALIMENTOS O BEBIDAS QUE DEBE EVITAR O LIMITAR   Fumar o consumir tabaco. La nicotina es uno de los estimulantes ms potentes en la produccin de cido en el tracto gastrointestinal.  Caf y t negro con cafena o descafeinado.  Gaseosas comunes o bajas caloras o bebidas energizantes (las gaseosas sin cafena estn permitidas).   Especias picantes, como la pimienta negra, pimienta blanca, pimienta roja, pimienta de cayena, curry en polvo,y [place] en polvo.  Menta y mentol.  Chocolate.  Alimentos con alto contenido de grasas, incluyendo las carnes y comidas fritas. El agregado de [place] extra, por ejemplo aceite, [place], aderezo para ensaladas y nueces. Limite estos alimentos a menos de 8 cucharaditas por da.  Las frutas y verduras si no son toleradas, tales como frutas ctricas o tomates.  El alcohol.  Todo alimento que agrave el trastorno. Si tiene dudas relacionadas con la dieta, comunquese con el profesional que lo asiste o con un nutricionista matriculado.  OTROS FACTORES QUE PUEDEN ALIVIAR EL ERGE SON:  Comer lentamente, en un clima distendido.  Hacer 5 o 6 comidas pequeas por da en vez de tres grandes.  Suprimir por un [employer] alimentos que causen problemas.  No acostarse hasta despus de 3 horas de haber comido.  Mantener la cabeza elevada 6 a 9 pulgadas (15 a 23 cm) usando una cua de espuma o bloques debajo de las patas de la cama. Si permanece en una postura plana har empeorar los sntomas.  Mantngase fsicamente activo. Perder peso puede ser de ayuda para reducir el  [employer] adultos obesos o con sobrepeso.  Use ropas sueltas. EJEMPLO DE UN PLAN DE ALIMENTACIN  Este plan de alimentacin consiste en aproximadamente 2 000 caloras, segn las guas de alimentacin de https://www.bernard.org/.  Desayuno   taza de avena cocida.  1 porcin de fresas.  1 taza de [employer].  1 oz de almendras. Colacin  1 taza de rebanadas de pepino.  6 oz de yogur (elaborado con [employer] con bajo contenido de grasas o descremada). Almuerzo:  2 rebanada de pan integral.  2 oz de rebanadas de pavo.  2 cucharaditas de mayonesa.  1 taza de arndanos.  1 taza de guisantes. Colacin  6 crackers integrales.  1 oz ( 28 g) de queso en hebras. Cena   taza de arroz integral.  1 taza de vegetales variados.  1 cucharadita de aceite de oliva.  3 oz ( 84 g) de pescado grill. Document Released: 07/11/2005 Document Revised: 12/24/2011 [facility] Patient Information 2013 [place], [place].

## 2012-07-15 NOTE — Progress Notes (Signed)
Interpreter Wyvonnia Dusky Dr Berline Chough

## 2012-07-16 ENCOUNTER — Encounter: Payer: Self-pay | Admitting: Sports Medicine

## 2012-07-16 ENCOUNTER — Encounter (INDEPENDENT_AMBULATORY_CARE_PROVIDER_SITE_OTHER): Payer: Self-pay | Admitting: General Surgery

## 2012-07-24 ENCOUNTER — Encounter: Payer: Self-pay | Admitting: Sports Medicine

## 2012-07-24 DIAGNOSIS — M7710 Lateral epicondylitis, unspecified elbow: Secondary | ICD-10-CM | POA: Insufficient documentation

## 2012-07-24 DIAGNOSIS — Z0000 Encounter for general adult medical examination without abnormal findings: Secondary | ICD-10-CM | POA: Insufficient documentation

## 2012-07-24 DIAGNOSIS — E669 Obesity, unspecified: Secondary | ICD-10-CM | POA: Insufficient documentation

## 2012-07-24 NOTE — Assessment & Plan Note (Signed)
Prescription for omeprazole written.

## 2012-07-24 NOTE — Assessment & Plan Note (Signed)
Patient would like to return for nutrition counseling the patient was provided with basic nutritional information previous.  Patient is able to followup with me to further discuss this at his convenience.

## 2012-07-24 NOTE — Assessment & Plan Note (Signed)
Check lipids 

## 2012-07-24 NOTE — Assessment & Plan Note (Addendum)
Reviewed decentered strengthening exercises for wrist extensors.  Provided prescription strength aleve, ice when necessary.  Followup in 2-3 weeks consider injection, or referral for formal physical therapy

## 2012-07-24 NOTE — Progress Notes (Signed)
  [facility]  Patient name: Frank Wilcox MRN [id number]  Date of birth: 1981-08-15  CC & HPI:  Frank Wilcox is a 32 y.o. male presenting today to establish care.  He is a 31 year old male who is recently undergone a laparoscopic appendectomy in his experience some dyspepsia in the past but is otherwise healthy.  He presents today to discuss his reflux like symptoms as well as right lateral arm pain; to return discuss dietary changes and modifications she can make become healthier.  # Reflux is worse at night, especially after eating spicy foods.  Has been relieved with medication the past but is unsure of which.  # Right lateral arm pain has been present intermittently for 6 months.  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  Especially when lifting above head.  Difficulty with shaking of hand.  Pain is located over the lateral aspect of the elbow.  He is nitrite any home remedies or over-the-counter medications.   ROS:  Denies any chest pain shortness of breath, vision changes, cough congestion, nausea vomiting, diarrhea,  Pertinent History Reviewed:  Medical & Surgical Hx:  Reviewed: Significant for status post laparoscopic appendectomy  Medications: Reviewed & Updated - see associated section Social History: Reviewed - Significant for nonsmoker, drinks one to 2 alcoholic beverages prescription and 4 times per week.  Objective Findings:  Vitals:  Filed Vitals:   07/15/12 1429  BP: 116/77  Pulse: 75  Temp: 97.7 F (36.5 C)    PE: GENERAL:  Well built Hispanic male. In no discomfort; no respiratory distress. PSYCH: Alert and appropriately interactive; Insight:Good   H&N: AT/[place], trachea midline EENT:  MMM, no scleral icterus, EOMi HEART: RRR, S1/S2 heard, no murmur LUNGS: CTA B, no wheezes, no crackles EXTREMITIES: Moves all 4 extremities spontaneously, warm well perfused, no edema, bilateral DP  and PT pulses 2/4.   MSK: Tenderness palpation of the lateral condyle.  Pain with resisted extension of the wrist at the site of pain.  Tenderness over the extensor muscles.   Assessment & Plan:

## 2012-08-19 ENCOUNTER — Telehealth: Payer: Self-pay | Admitting: Sports Medicine

## 2012-08-19 NOTE — Telephone Encounter (Signed)
Pt called to set up an appt with you. Need to renew OC.  [place]

## 2012-12-31 ENCOUNTER — Encounter: Payer: Self-pay | Admitting: Sports Medicine

## 2012-12-31 ENCOUNTER — Ambulatory Visit (INDEPENDENT_AMBULATORY_CARE_PROVIDER_SITE_OTHER): Payer: No Typology Code available for payment source | Admitting: Sports Medicine

## 2012-12-31 VITALS — BP 116/70 | HR 74 | Temp 98.9°F | Ht 69.0 in | Wt 202.0 lb

## 2012-12-31 DIAGNOSIS — K3189 Other diseases of stomach and duodenum: Secondary | ICD-10-CM

## 2012-12-31 MED ORDER — AMOXICILLIN 875 MG PO TABS: 875.0000 mg | ORAL_TABLET | Freq: Two times a day (BID) | ORAL | Status: DC

## 2012-12-31 MED ORDER — PANTOPRAZOLE SODIUM 40 MG PO TBEC: 40.0000 mg | DELAYED_RELEASE_TABLET | Freq: Every day | ORAL | Status: DC

## 2012-12-31 MED ORDER — CLARITHROMYCIN 500 MG PO TABS: 500.0000 mg | ORAL_TABLET | Freq: Two times a day (BID) | ORAL | Status: DC

## 2012-12-31 NOTE — Patient Instructions (Signed)
Please try to Fill all 3 medications  The most important is the PROTONIX (pantoprazole)  Please follow up in 6-8 weeks

## 2012-12-31 NOTE — Progress Notes (Signed)
  [facility]  Patient name: Frank Wilcox MRN [id number]  Date of birth: [birth year]  CC & HPI:  Frank Wilcox is a 32 y.o. male presenting today for follow up of heartburn:  Reports that PPI helped slightly but only by 50%.  Epigastric discomfort still worse at night following large, spicy, high fat meal.  Not been taking any additional medication.  ROS:  No emesis or hematemesis  Pertinent History Reviewed:  Medical & Surgical Hx:  Reviewed: Significant for GERD, and lateral epicondylitis Medications: Reviewed & Updated - see associated section Social History: Reviewed -  reports that he has quit smoking. He has never used smokeless tobacco.  Objective Findings:  Vitals: BP 116/70  Pulse 74  Temp(Src) 98.9 F (37.2 C) (Oral)  Ht 5\' 9"  (1.753 m)  Wt 202 lb (91.627 kg)  BMI 29.82 kg/m2  PE: GENERAL:  Adult hispanic  male. In no discomfort; no respiratory distress.  Spanish interpreter present for entire visit. PSYCH: Alert and appropriately interactive; Insight:Good     Assessment & Plan:

## 2013-01-05 NOTE — Assessment & Plan Note (Signed)
Will treat empirically for H Pylori.   Rx and coupons provided. Pt voices understanding.  Pt reassured that prior CT scan and presentation is consistent with GERD.  Concerned that could be something worse such as pancreatic cancer.  No weight loss, melana, no hemachezia or hematemesis. Encouraged to limit EtOH >Address EtOH at next visit if not improved

## 2013-01-14 ENCOUNTER — Ambulatory Visit (INDEPENDENT_AMBULATORY_CARE_PROVIDER_SITE_OTHER): Payer: No Typology Code available for payment source | Admitting: Family Medicine

## 2013-01-14 VITALS — BP 118/78 | HR 70 | Temp 97.9°F | Ht 69.0 in | Wt 208.0 lb

## 2013-01-14 DIAGNOSIS — M549 Dorsalgia, unspecified: Secondary | ICD-10-CM

## 2013-01-14 LAB — POCT URINALYSIS DIPSTICK
Bilirubin, UA: NEGATIVE
Blood, UA: NEGATIVE
Glucose, UA: NEGATIVE
Spec Grav, UA: 1.02
Urobilinogen, UA: 0.2

## 2013-01-14 NOTE — Assessment & Plan Note (Signed)
Unsure etiology. Not present at the time of evaluation today. Plan: UA Ibuprofen/ tylenol PRN Discussed signs of worsening condition that should prompt re-evaluation. Follow up as needed.

## 2013-01-14 NOTE — Progress Notes (Signed)
Family Medicine Office Visit Note   Subjective:   Patient ID: Frank Wilcox, male  DOB: [birth year], 32 y.o.. MRN: [id number]   Pt that comes on same day appointment complaining of back pain. Visit conducted in Spanish. Pain started last Friday (5 days ago). Pt first noticed pain when he came form work at night time. It was described as dull pain located at his mid to lower back bilaterally with no radiation. Pain was 5/10 and has been intermittent. At this time pt does not have the pain. Denies urinary symptoms (frequency, dysuria); also chills or fever. No numbness, tingling or weakness on lower extremities.  Review of Systems:  Per HPI  Objective:   Physical Exam: Gen:  NAD HEENT: Moist mucous membranes  CV: Regular rate and rhythm, no murmurs rubs or gallops PULM: Clear to auscultation bilaterally. No wheezes/rales/rhonchi ABD: Soft, non tender, non distended, normal bowel sounds. No CVA tenderness. EXT: No edema Neuro: Alert and oriented x3. No focalization. Normal reflexes. MSK: No tenderness on vertebral processes or adjacent muscle/tissue with palpation. Normal ROM of thoracic and lumbar spine. Pain not reproducible with movement or palpation.  Assessment & Plan:

## 2013-01-14 NOTE — Patient Instructions (Addendum)
Yo le llamo en la tarde con el resultado de la orina. Puede tomar Ibuprofeno 400 - 600 mg (2-3 pastillas juntas) cada 8 horas si tiene dolor. Acuda a ser evaluado si desarrollara: - Incremento de dolor o el dolor se irradia hacia el abdomen o las piernas. - Adormecimiento o perdidia de la fuerza en las piernas. Grant Ruts, escalofrios, nausea o vomitos. - Cambios en la frecuencia o color de la orina. - O culaquir otro sintoma que le preocupe.

## 2013-01-15 ENCOUNTER — Telehealth: Payer: Self-pay | Admitting: Family Medicine

## 2013-01-15 NOTE — Telephone Encounter (Signed)
Called patient and informed about UA results. Patient continued to be pain-free today. Recommended to followup with PCP if pain returns or other symptoms appear.

## 2013-03-03 ENCOUNTER — Ambulatory Visit: Payer: No Typology Code available for payment source | Admitting: Sports Medicine

## 2013-04-03 ENCOUNTER — Ambulatory Visit (INDEPENDENT_AMBULATORY_CARE_PROVIDER_SITE_OTHER): Payer: No Typology Code available for payment source | Admitting: Sports Medicine

## 2013-04-03 ENCOUNTER — Encounter: Payer: Self-pay | Admitting: Sports Medicine

## 2013-04-03 VITALS — BP 109/70 | HR 77 | Temp 98.1°F | Ht 69.0 in | Wt 201.0 lb

## 2013-04-03 DIAGNOSIS — E669 Obesity, unspecified: Secondary | ICD-10-CM

## 2013-04-03 DIAGNOSIS — Z0000 Encounter for general adult medical examination without abnormal findings: Secondary | ICD-10-CM

## 2013-04-03 DIAGNOSIS — I951 Orthostatic hypotension: Secondary | ICD-10-CM

## 2013-04-03 LAB — BASIC METABOLIC PANEL
BUN: 18 mg/dL (ref 6–23)
Chloride: 104 mEq/L (ref 96–112)
Glucose, Bld: 91 mg/dL (ref 70–99)
Potassium: 4.2 mEq/L (ref 3.5–5.3)
Sodium: 140 mEq/L (ref 135–145)

## 2013-04-03 LAB — CBC
HCT: 42.9 % (ref 39.0–52.0)
MCH: 31 pg (ref 26.0–34.0)
MCV: 92.3 fL (ref 78.0–100.0)
RBC: 4.65 MIL/uL (ref 4.22–5.81)
RDW: 13.6 % (ref 11.5–15.5)
WBC: 6.6 10*3/uL (ref 4.0–10.5)

## 2013-04-03 NOTE — Assessment & Plan Note (Addendum)
Check orthostatics BMET, CBC Call with results in AM using language line

## 2013-04-04 NOTE — Progress Notes (Signed)
  [facility]  Patient name: Frank Wilcox MRN [id number]  Date of birth: Oct 24, 1980  CC & HPI:  Bud Kaeser is a 32 y.o. male presenting today for dizziness, and concerns for weight loss.   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  He denies any palpitations he denies any chest pain with this.  Denies any symptoms at rest.  He reports he has been intentionally trying to lose a significant amount of weight but people are getting concerned he is concerned he may have diabetes  ROS:  See above  Pertinent History Reviewed:  Medical & Surgical Hx:  Reviewed: Significant for highly anxious, back pain, borderline obesity Medications: Reviewed & Updated - see associated section Social History: Reviewed -  reports that he has quit smoking. He has never used smokeless tobacco.  Objective Findings:  Vitals: BP 109/70  Pulse 77  Temp(Src) 98.1 F (36.7 C) (Oral)  Ht 5\' 9"  (1.753 m)  Wt 201 lb (91.173 kg)  BMI 29.67 kg/m2  PE: GENERAL:  hispanic  male. In no discomfort; no respiratory distress.  Interpreter present PSYCH: Alert and appropriately interactive; Insight:Good   H&N: AT/[place], trachea midline EENT:  MMM, no scleral icterus, EOMi HEART: RRR, S1/S2 heard, no murmur LUNGS: CTA B, no wheezes, no crackles EXTREMITIES: Moves all 4 extremities spontaneously, warm well perfused, no edema, bilateral DP and PT pulses 2/4.      Assessment & Plan:

## 2013-04-10 NOTE — Assessment & Plan Note (Addendum)
Patient has had a significant intentional weight loss.  I have encouraged him to continue using healthy food choices however to ensure that he is having adequate intake throughout the day.  This may be contributing to his orthostasis

## 2013-04-10 NOTE — Assessment & Plan Note (Signed)
Patient is concerned that he may have diabetes.  He has had normal labs in the past but given orthostasis I will recheck labs.

## 2013-04-26 IMAGING — CT CT ABD-PELV W/ CM
2 of 4 series · 14 of 32 positions shown, 19 images · IV contrast (&)
Comparison: None

CLINICAL DATA: Abdominal pain.

CT ABDOMEN AND PELVIS WITH CONTRAST
TECHNIQUE: Multidetector CT imaging of the abdomen and pelvis was
performed following the standard protocol during bolus
administration of intravenous contrast.
Contrast: 100mL OMNIPAQUE IOHEXOL 300 MG/ML  SOLN, 1 OMNIPAQUE
IOHEXOL 300 MG/ML  SOLN

[Series 2: routine abdomen · axial · 0.70mm/px · z∈[-467,-142]mm · 7 of 82 slices shown, 12 images]
[im 11/82  soft-tissue]
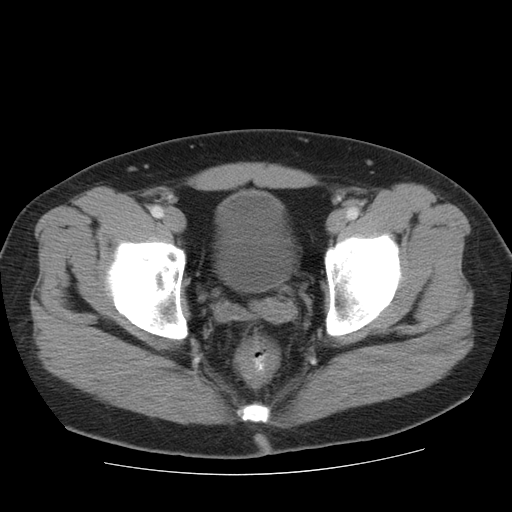
[im 11/82  bone]
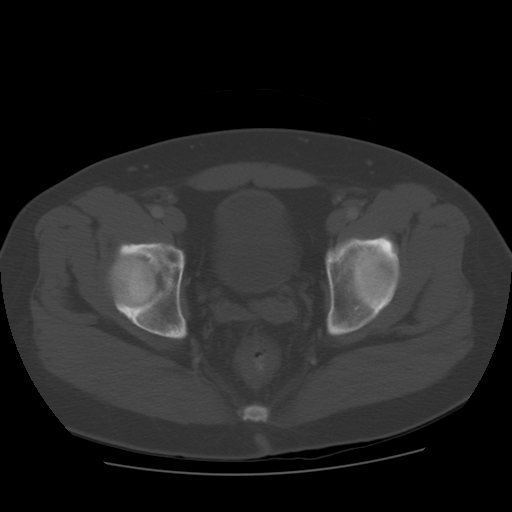
[im 21/82  soft-tissue]
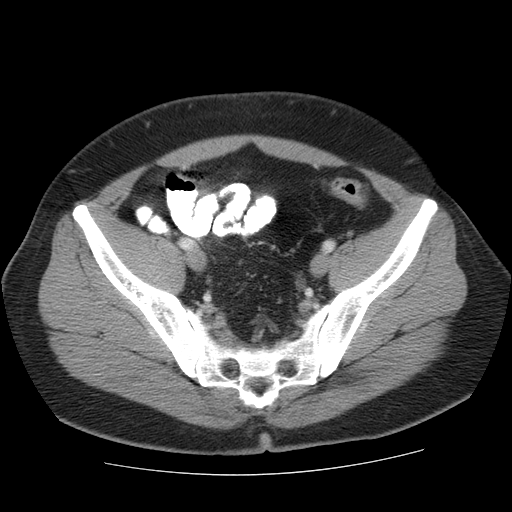
[im 31/82  soft-tissue]
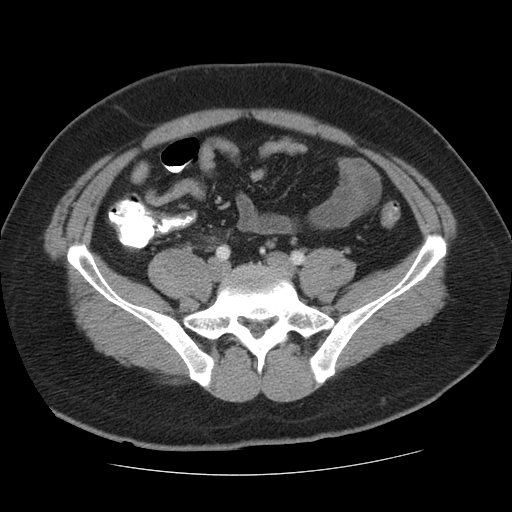
[im 41/82  soft-tissue]
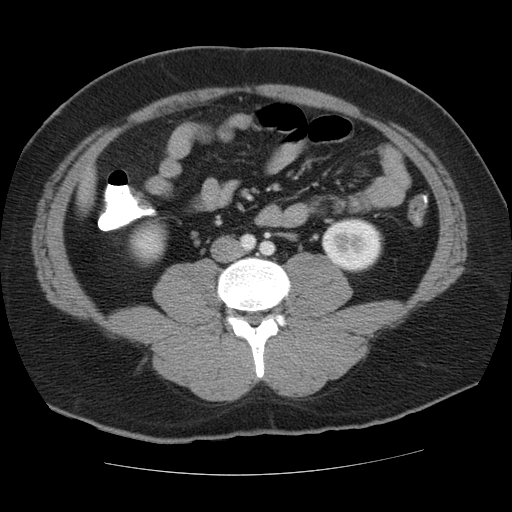
[im 41/82  lung]
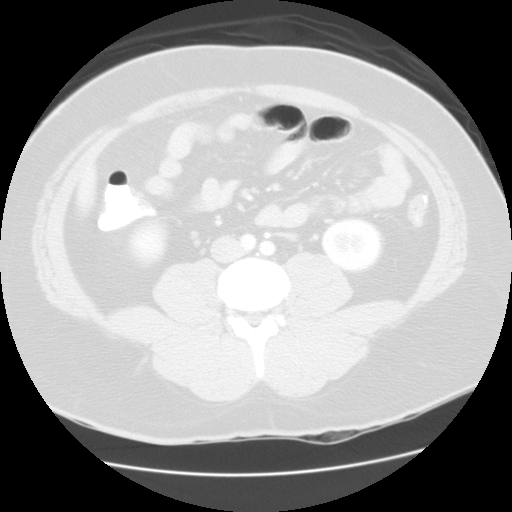
[im 51/82  soft-tissue]
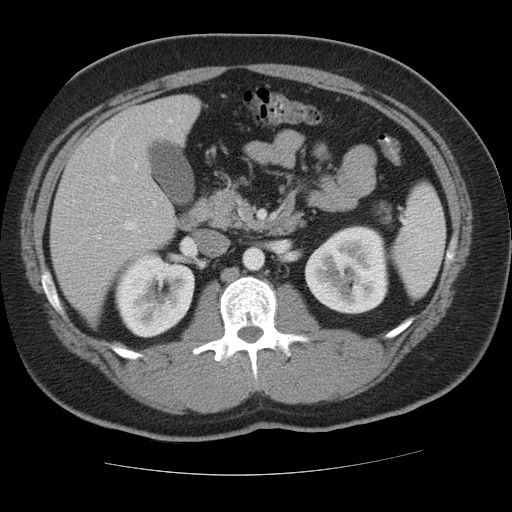
[im 51/82  lung]
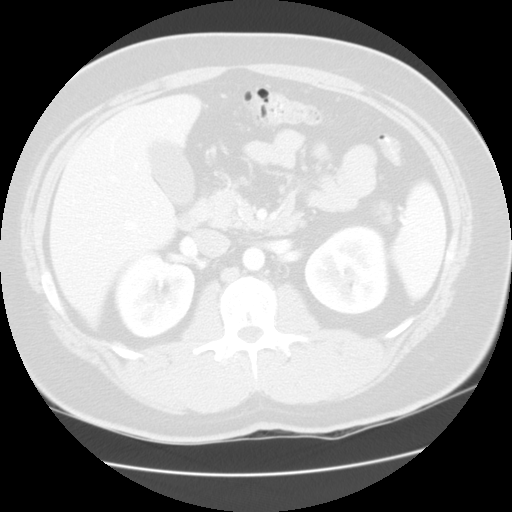
[im 61/82  soft-tissue]
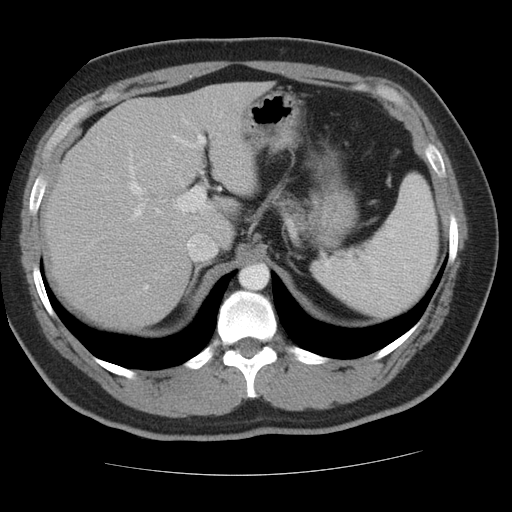
[im 61/82  lung]
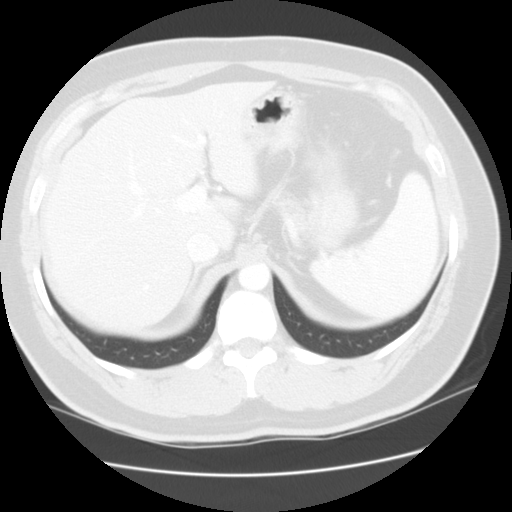
[im 71/82  soft-tissue]
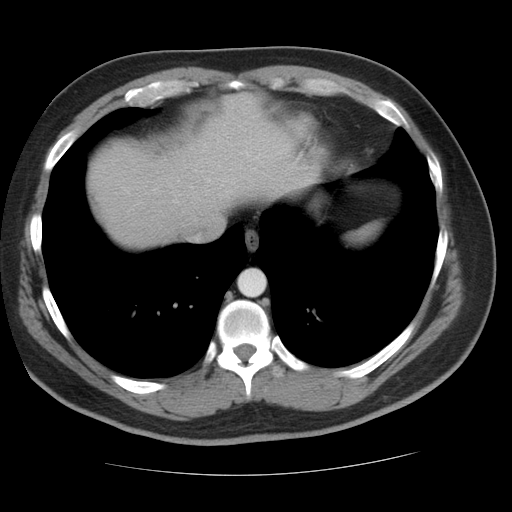
[im 71/82  lung]
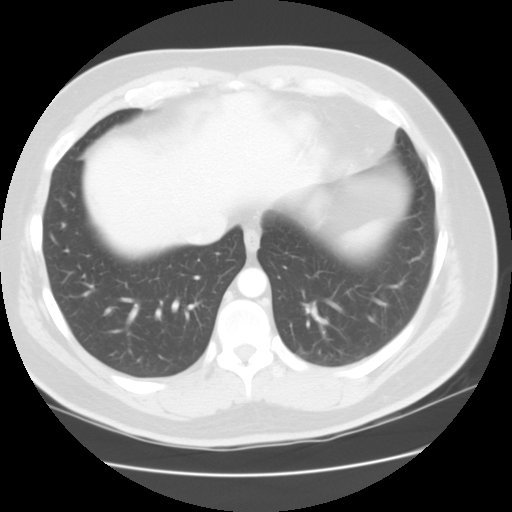

[Series 401: sagittals · sagittal · 0.90mm/px · 7 of 107 slices shown]
[im 11/107  soft-tissue]
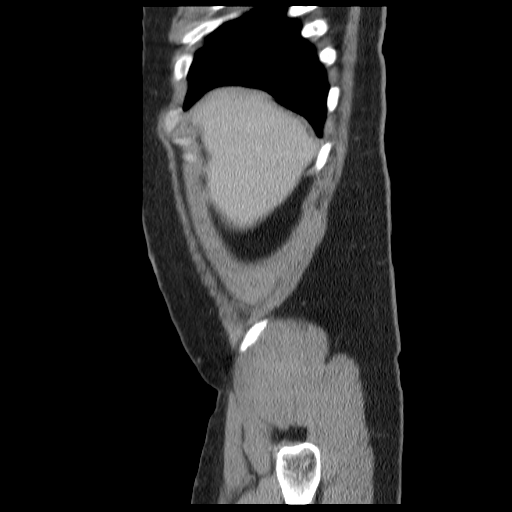
[im 22/107  soft-tissue]
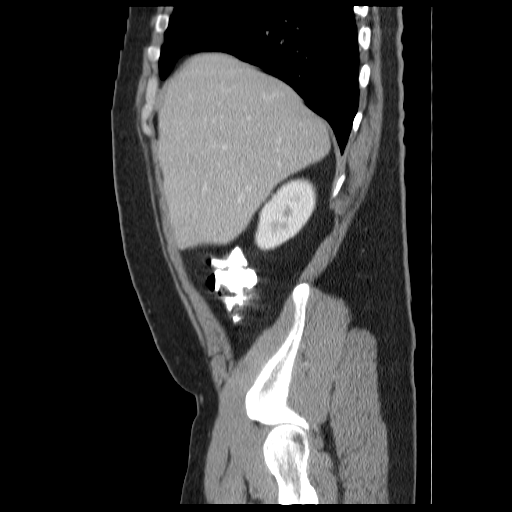
[im 32/107  soft-tissue]
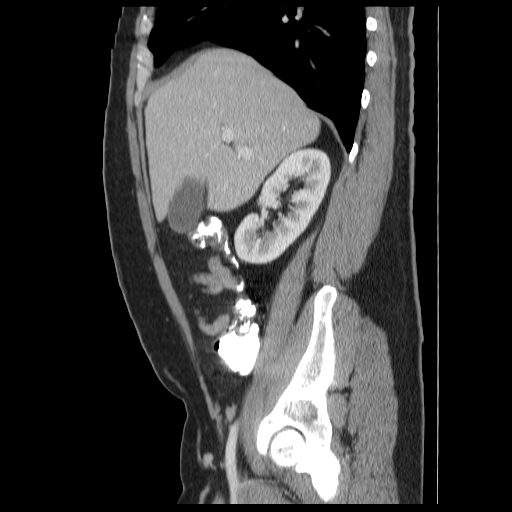
[im 43/107  soft-tissue]
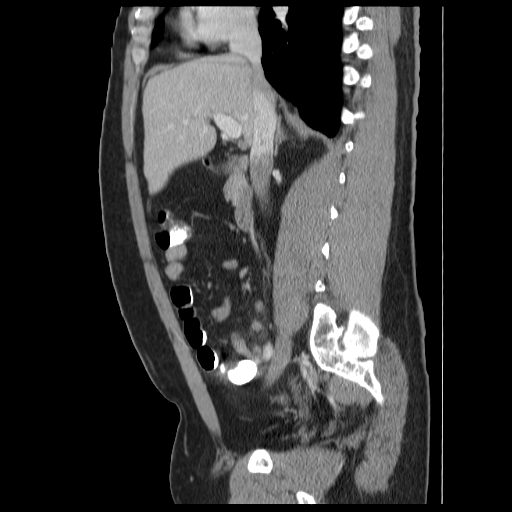
[im 64/107  soft-tissue]
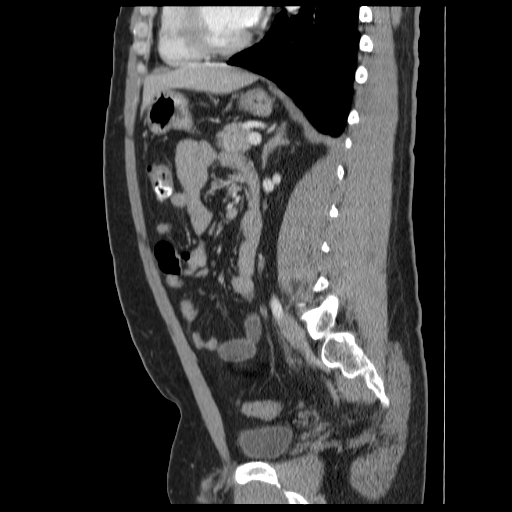
[im 75/107  soft-tissue]
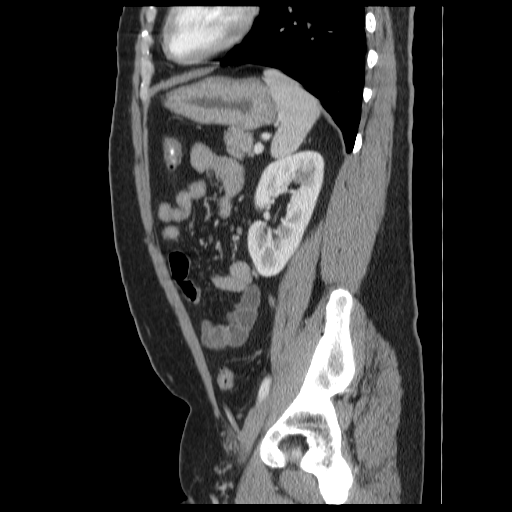
[im 85/107  soft-tissue]
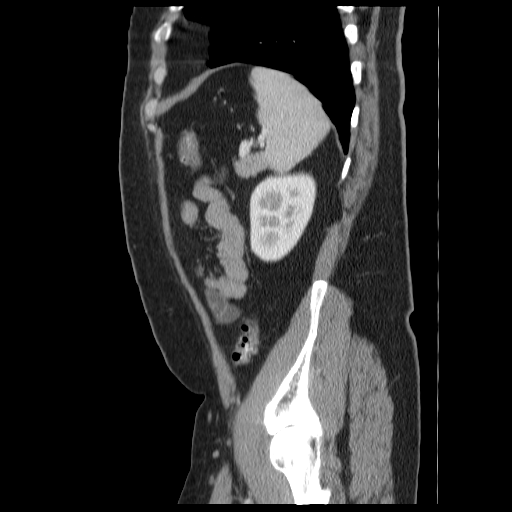

[14 of 32 positions shown; findings below may reference images not displayed]

FINDINGS: The lung bases are clear.  No pleural effusion.

The solid abdominal organs are normal.  Mild diffuse fatty
infiltration of the liver is noted.  The gallbladder is normal.  No
common bile duct dilatation.

The stomach, duodenum, small bowel and colon are unremarkable.  The
appendix is thickened and demonstrates inflammatory change
consistent with early acute appendicitis.  Adjacent inflamed lymph
nodes are noted.  Mild associated inflammation of the terminal
ileum.  There is a tiny amount of free pelvic fluid.

The aorta is normal.  The major branch vessels are normal.

The bladder, prostate gland and seminal vesicles are unremarkable.
No inguinal mass or hernia.

The bony structures are unremarkable.
IMPRESSION: Early acute appendicitis.

## 2013-05-05 ENCOUNTER — Emergency Department (INDEPENDENT_AMBULATORY_CARE_PROVIDER_SITE_OTHER)
Admission: EM | Admit: 2013-05-05 | Discharge: 2013-05-05 | Disposition: A | Discharge status: Home / Self Care | Payer: No Typology Code available for payment source | Source: Home / Self Care | Attending: Family Medicine | Admitting: Family Medicine

## 2013-05-05 ENCOUNTER — Encounter (HOSPITAL_COMMUNITY): Payer: Self-pay | Admitting: Emergency Medicine

## 2013-05-05 DIAGNOSIS — K2970 Gastritis, unspecified, without bleeding: Secondary | ICD-10-CM

## 2013-05-05 LAB — HEPATIC FUNCTION PANEL
ALT: 19 U/L (ref 0–53)
AST: 26 U/L (ref 0–37)
Bilirubin, Direct: <0.1 mg/dL (ref 0.0–0.3)
Total Bilirubin: 0.4 mg/dL (ref 0.3–1.2)

## 2013-05-05 MED ORDER — GI COCKTAIL ~~LOC~~
30.0000 mL | Freq: Once | ORAL | Status: AC
  Administered 2013-05-05: 30 mL via ORAL

## 2013-05-05 MED ORDER — GI COCKTAIL ~~LOC~~
ORAL | Status: AC
  Filled 2013-05-05: qty 30

## 2013-05-05 MED ORDER — PANTOPRAZOLE SODIUM 40 MG PO TBEC: 40.0000 mg | DELAYED_RELEASE_TABLET | Freq: Every day | ORAL | Status: DC

## 2013-05-05 MED ORDER — SUCRALFATE 1 GM/10ML PO SUSP: ORAL | Status: DC

## 2013-05-05 NOTE — ED Provider Notes (Signed)
History    CSN: [id number] Arrival date & time 05/05/13  1845  First MD Initiated Contact with Patient 05/05/13 1857     Chief Complaint  Patient presents with  . Abdominal Pain   (Consider location/radiation/quality/duration/timing/severity/associated sxs/prior Treatment) HPI Comments: 32 year old male presents complaining of upper abdominal pain for the past 2-3 weeks. He describes this as an intermittent burning is somewhat worse at night. He has a history of gastritis but felt very similar to this in the epigastrium but he did not previously have pain out of the sides. He relates this pain to beginning after he started drinking a lot, approximately 340 ounce cans of beer daily, which he quit doing 1 weeks ago. He is concerned that his pain is an indication of a liver problem or hepatitis and wants to have a hepatic function panel drawn, although he has no previous problems with his liver or diagnosis of hepatitis and a biopsy confirmed gastritis a few months ago. He denies nausea, vomiting, diarrhea, dark stools, dark urine, fever, chills, chest pain, shortness of breath, or any other symptoms apart from this upper abdominal pain  Patient is a 32 y.o. male presenting with abdominal pain.  Abdominal Pain Associated symptoms include abdominal pain. Pertinent negatives include no chest pain and no shortness of breath.   Past Medical History  Diagnosis Date  . Unspecified gastritis and gastroduodenitis without mention of hemorrhage   . Esophageal reflux   . Alcohol abuse   . Anxiety   . Sleep disturbance, unspecified   . S/P laparoscopic appendectomy 06/25/2012   Past Surgical History  Procedure Laterality Date  . Ankle surgery      right  . Laparoscopic appendectomy  06/06/2012    Procedure: APPENDECTOMY LAPAROSCOPIC;  Surgeon: Liz Malady, MD;  [facility];  Service: General;  Laterality: N/A;  . Appendectomy     Family History  Problem Relation Age of Onset  .  Diabetes Mother    History  Substance Use Topics  . Smoking status: Former Games developer  . Smokeless tobacco: Never Used  . Alcohol Use: 0.0 oz/week     Comment: occasional    Review of Systems  Constitutional: Negative for fever, chills and fatigue.  HENT: Negative for sore throat, neck pain and neck stiffness.   Eyes: Negative for visual disturbance.  Respiratory: Negative for cough and shortness of breath.   Cardiovascular: Negative for chest pain, palpitations and leg swelling.  Gastrointestinal: Positive for abdominal pain. Negative for nausea, vomiting, diarrhea and constipation.  Genitourinary: Negative for dysuria, urgency, frequency and hematuria.  Musculoskeletal: Negative for myalgias and arthralgias.  Skin: Negative for rash.  Neurological: Negative for dizziness, weakness and light-headedness.    Allergies  Review of patient's allergies indicates no known allergies.  Home Medications   Current Outpatient Rx  Name  Route  Sig  Dispense  Refill  . omeprazole (PRILOSEC) 40 MG capsule   Oral   Take 1 capsule (40 mg total) by mouth daily.   30 capsule   2     Print in spanish   . pantoprazole (PROTONIX) 40 MG tablet   Oral   Take 1 tablet (40 mg total) by mouth daily.   30 tablet   3   . sucralfate (CARAFATE) 1 GM/10ML suspension      Take 2 teaspoons by mouth 4 times daily, approximately one hour before meals and before bedtime, as needed   420 mL   0    BP 123/60  Pulse 68  Temp(Src) 98.1 F (36.7 C) (Oral)  Resp 16  SpO2 99% Physical Exam  Constitutional: He is oriented to person, place, and time. He appears well-developed and well-nourished. No distress.  HENT:  Head: Normocephalic and atraumatic.  Eyes: EOM are normal. Pupils are equal, round, and reactive to light.  Cardiovascular: Normal rate and regular rhythm.  Exam reveals no gallop and no friction rub.   No murmur heard. Pulmonary/Chest: Effort normal and breath sounds normal. No  respiratory distress. He has no wheezes. He has no rales.  Abdominal: Soft. Normal appearance and bowel sounds are normal. There is no hepatosplenomegaly. There is no tenderness. There is no rigidity, no rebound, no guarding, no CVA tenderness, no tenderness at McBurney's point and negative Murphy's sign. No hernia.  Neurological: He is oriented to person, place, and time.  Skin: Skin is warm and dry. No rash noted.  Psychiatric: Judgment normal. His mood appears anxious.    ED Course  Procedures (including critical care time) Labs Reviewed  HEPATIC FUNCTION PANEL  LIPASE, BLOOD   No results found. 1. Gastritis     No signs or symptoms of acute abdomen. Giving GI cocktail  MDM  The exam is completely normal today I discussed the plan with the patient that I would like to give him the GI cocktail and if it is abdominal discomfort did not resolve this and the hepatic function panel. His abdominal burning but it completely resolved with a GI cocktail but he still wants to have the labs sent. I will start him back on his Protonix and also Carafate suspension as needed. Followup if the abdominal pain does not improve, either here or with primary care physician   Meds ordered this encounter  Medications  . gi cocktail (Maalox,Lidocaine,Donnatal)    Sig:   . sucralfate (CARAFATE) 1 GM/10ML suspension    Sig: Take 2 teaspoons by mouth 4 times daily, approximately one hour before meals and before bedtime, as needed    Dispense:  420 mL    Refill:  0  . pantoprazole (PROTONIX) 40 MG tablet    Sig: Take 1 tablet (40 mg total) by mouth daily.    Dispense:  30 tablet    Refill:  3     Graylon Good, PA-C 05/05/13 2056

## 2013-05-05 NOTE — ED Notes (Addendum)
Pt c/o epigastric pain onset 1.5 weeks... Denies: inj/trauma, f/v/n/d, constipation... Pain is intermittent... Drinks alcohol and believes it may have been due to heavy drinking; will usually have x3 40oz can of beers... Hx of acid reflux... He is alert w/no signs of acute distress.

## 2013-05-06 NOTE — ED Provider Notes (Signed)
Medical screening examination/treatment/procedure(s) were performed by resident physician or non-physician practitioner and as supervising physician I was immediately available for consultation/collaboration.   KINDL,JAMES DOUGLAS MD.   James D Kindl, MD 05/06/13 2030 

## 2013-06-22 ENCOUNTER — Ambulatory Visit (INDEPENDENT_AMBULATORY_CARE_PROVIDER_SITE_OTHER): Payer: Self-pay | Admitting: Family Medicine

## 2013-06-22 ENCOUNTER — Encounter: Payer: Self-pay | Admitting: Family Medicine

## 2013-06-22 VITALS — BP 105/72 | HR 58 | Ht 68.75 in | Wt 199.0 lb

## 2013-06-22 DIAGNOSIS — F1010 Alcohol abuse, uncomplicated: Secondary | ICD-10-CM | POA: Insufficient documentation

## 2013-06-22 DIAGNOSIS — K2970 Gastritis, unspecified, without bleeding: Secondary | ICD-10-CM

## 2013-06-22 DIAGNOSIS — R1013 Epigastric pain: Secondary | ICD-10-CM

## 2013-06-22 DIAGNOSIS — F1020 Alcohol dependence, uncomplicated: Secondary | ICD-10-CM

## 2013-06-22 DIAGNOSIS — K3189 Other diseases of stomach and duodenum: Secondary | ICD-10-CM

## 2013-06-22 LAB — POCT H PYLORI SCREEN: H Pylori Screen, POC: POSITIVE

## 2013-06-22 LAB — COMPREHENSIVE METABOLIC PANEL
AST: 19 U/L (ref 0–37)
Alkaline Phosphatase: 56 U/L (ref 39–117)
BUN: 18 mg/dL (ref 6–23)
Creat: 0.87 mg/dL (ref 0.50–1.35)
Glucose, Bld: 79 mg/dL (ref 70–99)
Total Bilirubin: 0.5 mg/dL (ref 0.3–1.2)

## 2013-06-22 NOTE — Assessment & Plan Note (Signed)
Current triggers include coffee, almost daily alcohol intake, and chilis/peppers. Recommended to continue with Protonix for short term.  Currently on BID dosing. Patient requested CMET/lipase ("liver and pancreas check") and this is what triggered questions regarding alcoholism -- see above.  Normal CMET several months ago.  Repeated today for patient reassurance.   Also checking H pylori as he has not had this previously.   FU in 2 weeks, mostly to ensure he has followed up with referral for treatment for alcoholism, also for lab results plus ensure improvement with Protonix.

## 2013-06-22 NOTE — Patient Instructions (Addendum)
Tomo su medicina (el Protonix).   Nosotros le llamamos con los resultatados.    Norma nuestro trabajado social, Advertising account planner.

## 2013-06-22 NOTE — Assessment & Plan Note (Signed)
Referral today to Theresia Bough for social work and recommendations/referrals for alcoholism.   Discussed how this is exacerbating dyspepsia.

## 2013-06-22 NOTE — Progress Notes (Signed)
Patient ID: Edrian Melucci, male   DOB: 1981-03-03, 32 y.o.   MRN: [id number]  [facility] - [facility] M. Shermar Friedland, MD Phone: [phone]   Subjective: HPI: Patient is a 32 y.o. male presenting to clinic today for abdominal pain for 1 month. Ashby Dawes was present as interpreter for entire visit.  Patient has PMH of gastritis, presenting with persistent abdominal pain for the last month. He was last evaluated in July for the same problem and prescribed Protonix. He states this helps some but not always. He had EGD in April 2013 which was normal as well. Today he is requesting to be checked for "other diseases" that could cause his pain including his pancreas and liver. Patient states he has not had a change in his diet. He eats chicken, meat, peppers (he does not think any of this makes his pain worse.) His pain is worse with coffee and with drinking alcohol. He does not smoke. He does not take NSAIDs.  On further questioning with Dr. Gwendolyn Grant, patient states that his alcohol intake has increased recently. He states he drinks maybe 10 beers per day. He states when his abdominal pain has gone away he thinks he is "cured" and starts drinking again, then the pain returns. He does feel like he needs to cut back, and he is ready for resources to help him.  History Reviewed: Former smoker.  ROS: Please see HPI above.  Objective: Office vital signs reviewed. BP 105/72  Pulse 58  Ht 5' 8.75" (1.746 m)  Wt 199 lb (90.266 kg)  BMI 29.61 kg/m2  Physical Examination:  General: Awake, alert. NAD.  HEENT: Atraumatic, normocephalic. MMM Pulm: CTAB, no wheezes Cardio: RRR, no murmurs appreciated Abdomen:+BS, soft, mild epigastric tenderness, nondistended Extremities: No edema Neuro: Grossly intact  Assessment: 32 y.o. male with abdominal pain  Plan: See Problem List and After Visit Summary

## 2013-06-23 ENCOUNTER — Telehealth: Payer: Self-pay | Admitting: Clinical

## 2013-06-23 NOTE — Telephone Encounter (Signed)
Clinical Child psychotherapist (CSW) received a referral to provide pt with resources for substance abuse. CSW contacted pt mobile number [phone] however pt stated he was currently at work and would return CSW call when available.  Theresia Bough, MSW, LCSW [phone]

## 2013-06-24 ENCOUNTER — Telehealth: Payer: Self-pay | Admitting: Family Medicine

## 2013-06-24 MED ORDER — OMEPRAZOLE 40 MG PO CPDR: 40.0000 mg | DELAYED_RELEASE_CAPSULE | Freq: Two times a day (BID) | ORAL | Status: DC

## 2013-06-24 MED ORDER — CLARITHROMYCIN 500 MG PO TABS: 500.0000 mg | ORAL_TABLET | Freq: Two times a day (BID) | ORAL | Status: DC

## 2013-06-24 MED ORDER — AMOXICILLIN 500 MG PO TABS: 1000.0000 mg | ORAL_TABLET | Freq: Two times a day (BID) | ORAL | Status: DC

## 2013-06-24 NOTE — Telephone Encounter (Signed)
Marines, please call the patient:    His H Pylori test (the bacterium in his stomach which we discussed at his visit) was positive.  This means he needs to be treated for 14 days.  He will need to take the following:  - Omeprazole 1 pill twice a day (he is already taking this once a day but needs to increase to twice a day) - Amoxicillin 2 pills twice a day. - Clarithromycin 1 pill twice a day.  I have faxed these into the health dept.  Please let me know if you have questions.  He needs to follow up after taking these medicines in 2-3 weeks.  Thanks!  Trey Paula

## 2013-06-25 NOTE — Telephone Encounter (Signed)
Pt is aware about doctor's instruction. Pt will call to set up an appt.   Marines

## 2013-06-29 ENCOUNTER — Ambulatory Visit: Payer: No Typology Code available for payment source

## 2013-09-28 ENCOUNTER — Ambulatory Visit (INDEPENDENT_AMBULATORY_CARE_PROVIDER_SITE_OTHER): Payer: No Typology Code available for payment source | Admitting: Family Medicine

## 2013-09-28 VITALS — BP 111/53 | HR 74 | Temp 99.2°F | Ht 69.0 in | Wt 196.0 lb

## 2013-09-28 DIAGNOSIS — M25559 Pain in unspecified hip: Secondary | ICD-10-CM

## 2013-09-28 DIAGNOSIS — M25551 Pain in right hip: Secondary | ICD-10-CM | POA: Insufficient documentation

## 2013-09-28 DIAGNOSIS — R109 Unspecified abdominal pain: Secondary | ICD-10-CM

## 2013-09-28 LAB — POCT URINALYSIS DIPSTICK
Bilirubin, UA: NEGATIVE
Glucose, UA: NEGATIVE
Leukocytes, UA: NEGATIVE
Nitrite, UA: NEGATIVE
pH, UA: 5.5

## 2013-09-28 NOTE — Assessment & Plan Note (Signed)
Likely hip flexor strain w/ possible gluteal strain as well.  No sign of hernia, OA, other inflammatory process Exercises, NSAIDs, rest. If no improvement in 4 wks consider Xray

## 2013-09-28 NOTE — Progress Notes (Signed)
Frank Wilcox is a 32 y.o. male who presents to [facility] today for SD appt for back pain.  Hip/groin pain bilat. Onset started about 3 wks ago. 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 Better w/ movement. Worse after sitting for a long time. Denies previous trauma to the area. Denies swelling to the hip/groinarea, abd pain, difficulty w/ bowels swelling in scrotum. Feels like something "pulling". Comes and goes.   The following portions of the patient's history were reviewed and updated as appropriate: allergies, current medications, past medical history, family and social history, and problem list.  Patient is a nonsmoker.  Past Medical History  Diagnosis Date  . Unspecified gastritis and gastroduodenitis without mention of hemorrhage   . Esophageal reflux   . Alcohol abuse   . Anxiety   . Sleep disturbance, unspecified   . S/P laparoscopic appendectomy 06/25/2012    ROS as above otherwise neg.    Medications reviewed. Current Outpatient Prescriptions  Medication Sig Dispense Refill  . amoxicillin (AMOXIL) 500 MG tablet Take 2 tablets (1,000 mg total) by mouth 2 (two) times daily. X 14 days  56 tablet  0  . clarithromycin (BIAXIN) 500 MG tablet Take 1 tablet (500 mg total) by mouth 2 (two) times daily. X 14 days  28 tablet  0  . omeprazole (PRILOSEC) 40 MG capsule Take 1 capsule (40 mg total) by mouth 2 (two) times daily. X 14 days  28 capsule  0  . pantoprazole (PROTONIX) 40 MG tablet Take 1 tablet (40 mg total) by mouth daily.  30 tablet  3  . sucralfate (CARAFATE) 1 GM/10ML suspension Take 2 teaspoons by mouth 4 times daily, approximately one hour before meals and before bedtime, as needed  420 mL  0   No current facility-administered medications for this visit.    Exam: BP 111/53  Pulse 74  Temp(Src) 99.2 F (37.3 C) (Oral)  Ht 5\' 9"  (1.753 m)  Wt 196 lb (88.905 kg)  BMI 28.93 kg/m2 Gen: Well  NAD HEENT: EOMI,  MMM Lungs: CTABL Nl WOB Heart: RRR no MRG Abd: NABS, NT, ND Exts: Non edematous BL  LE, warm and well perfused.  MSK: FROM of hips bilat. Straight leg rais to 100 degrees w/o pain, FABERs w/o pain, internal/external rotation w/o pain. Ambulation nml. Leg length equal. Flexion and extension against resistance w/o pain. 5/5 strength. No bony abnormality  Results for orders placed in visit on 09/28/13 (from the past 72 hour(s))  POCT URINALYSIS DIPSTICK     Status: None   Collection Time    09/28/13  1:34 PM      Result Value Range   Color, UA YELLOW     Clarity, UA CLEAR     Glucose, UA NEG     Bilirubin, UA NEG     Ketones, UA NEG     Spec Grav, UA >=1.030     Blood, UA NEG     pH, UA 5.5     Protein, UA NEG     Urobilinogen, UA 0.2     Nitrite, UA NEG     Leukocytes, UA Negative      A/P (as seen in Problem list)  Hip pain, bilateral Likely hip flexor strain w/ possible gluteal strain as well.  No sign of hernia, OA, other inflammatory process Exercises, NSAIDs, rest. If no improvement in 4 wks consider Xray

## 2013-09-28 NOTE — Patient Instructions (Signed)
Thank you for coming in today You likely have a hip and gluteal strain Please start your exercises daily. Perform your exercises twice a day. (3 sets of 15 repetitions) Please come back in 4 weeks if you are not better Please start taking ibuprofen 600mg  every 6 hours for the pain There is no evidence of fracture or major injury We can obtain x-rays if you do not better.   Gracias por venir hoy Es posible que tenga una cepa de la cadera y los glteos Por favor, comience sus ejercicios diarios . Realice sus ejercicios dos veces al da . ( 3 series de 15 repeticiones ) Por favor, vuelve en 4 semanas si usted no es mejor Por favor, comience a tomar 600 mg de ibuprofeno cada 6 horas para el dolor No hay evidencia de fractura o lesin grave Podemos obtener radiografas si no mejor.  Sndrome del glteo mediano con rehabilitacin (Gluteus Medius Syndrome with Rehab) El sndrome de glteo mediano es una enfermedad que causa dolor e inflamacin en la parte externa de la cadera. Est causado por una lesin muscular (esguince) en el msculo o el tendn del glteo medio. El msculo glteo medio es responsable de mover el muslo lejos del otro muslo (abductor), as como la estabilizacin de la cadera al caminar, correr y Probation officer. El sndrome del glteo medio puede ser un esguince de [place] 1  2. Los esguinces de grado 1 ocasionan dolor, pero el tendn no est alargado. En los esguinces de grado 2 hay un ligamento alargado, debido a un estiramiento o desgarro parcial. En el esguince de [place] 2 an se conserva la funcin, aunque puede estar disminuida.  SNTOMAS  Dolor y en general renguera al caminar o al correr.  Sensibilidad en la zona externa de la cadera.  Dolor, sensibilidad, hinchazn, calor o enrojecimiento en la zona externa del muslo, que generalmente empeora al mover la cadera.  Aumento de la debilidad en la pierna.  Dolor o la debilidad que empeora cuando se mueve el muslo hacia el  exterior . CAUSAS El sndrome del glteo medio puede ser causado por un dao grave (agudo) o progresivo (crnico). Estas lesiones suelen deberse a un aumento brusco en la duracin, frecuencia o en la intensidad del entrenamiento. Generalmente se asocia a la inclinacin de la pelvis al correr.  EL RIESGO AUMENTA CON  Deportes de resistencia (carreras de distancia, triatln, carreras pedestres), o actividades que requieren correr en bordillos de las aceras o superficies inclinadas. O si el pie cruza la lnea media hacia la otra pierna, al correr  Poca fuerza y flexibilidad.  Precalentamiento y elongacin inadecuados antes de la [place].  Piernas de longitud diferente (afecta a la pierna ms larga)  Problemas de alineacin de las extremidades inferiores, incluyendo una pelvis ancha y rodillas excesivamente arqueadas MEDIDAS PREVENTIVAS  Precalentamiento adecuado y elongacin antes de la [place].  Mantener la forma fsica:  [place], flexibilidad y resistencia muscular.  Capacidad cardiovascular.  Aprenda y USAA.  Use un zapato alto (ortsico) si las piernas no tienen el mismo largo. PRONSTICO Si se trata adecuadamente, por lo general desaparece entre 2 y 6 semanas.  POSIBLES COMPLICACIONES  Tiempo de curacin prolongado, si no se trata adecuadamente o no se le da el tiempo suficiente como para curarse.  Inflamacin crnica del tendn, que causa un dolor persistente que puede avanzar hacia un dolor constante.  Recurrencia de sntomas, si se retoma la actividad rpidamente, con el uso excesivo, con un golpe directo o  con tcnicas incorrectas. MEDIDAS GENERALES PARA EL TRATAMIENTO El tratamiento inicial consiste en la toma de medicamentos y la aplicacin de hielo para Engineer, materials y reducir la hinchazn. Tambin es Chief Executive Officer los ejercicios de fortalecimiento y Therapist, nutritional, as como la modificacin de las actividades que agravan los sntomas.  Los ejercicios pueden Management consultant o con un terapeuta. Para las personas con piernas desiguales en longitud, se podr recomendar un tacn (ortopdico). En raras ocasiones es necesaria la [place] y slo se considera despus de ms de 6 meses de tratamiento sin xito. MEDICAMENTOS:   Si es necesaria la administracin de medicamentos para Chief Technology Officer, se recomiendan los antiinflamatorios no esteroides, (aspirina e ibuprofeno) y otros calmantes menores, (acetaminofeno).  No tome medicamentos para el dolor dentro de los [address] previos a la [place].  El profesional podr prescribirle calmantes si lo considera necesario. Utilcelos como se le indique y slo cuando lo necesite.  Se podrn recomendar inyecciones de corticoesteorides. Sin embargo, estas inyecciones deben reservarse para los casos graves, porque slo se pueden administrar una determinada cantidad de veces. CALOR Y FRO:   El tratamiento con fro [employer] y reduce la inflamacin. El fro debe aplicarse durante 10 a 15 minutos cada 2  3 horas inmediatamente despus de cualquier [employer] sntomas. Utilice bolsas o un masaje de hielo.  El calor puede usarse antes de Therapist, music y de las actividades de fortalecimiento indicadas por el profesional, el fisioterapeuta o Orthoptist. Utilice una bolsa trmica o un pao hmedo. SOLICITE ATENCIN MDICA SI:   Los sntomas empeoran o no mejoran en 2 semanas, a pesar de Medical illustrator.  Desarrolla nuevos e inexplicables sntomas. (las drogas [employer] en el tratamiento le ocasionan efectos secundarios). EJERCICIOS EJERCICIOS DE AMPLITUD DE MOVIMIENTOS Y ELONGACIN  Sndrome del glteo medio Estos ejercicios le ayudarn en la recuperacin de la lesin. Los sntomas podrn desaparecer con o sin mayor intervencin del profesional, el fisioterapeuta o Orthoptist. Al completar estos ejercicios, recuerde:   Restaurar la flexibilidad del tejido ayuda a que las  articulaciones recuperen el movimiento normal. Esto permite que el movimiento y la actividad sea ms saludables y menos dolorosos.  Para que sea efectiva, cada elongacin debe realizarse durante al menos 30 segundos.  La elongacin nunca debe ser dolorosa. Deber sentir slo un alargamiento o distensin suave del tejido que estira. ELONGACIN Rotadores de la cadera  Recustese sobre su espalda en una superficie firme. Tome su rodilla derecha / izquierdo con la mano derecha / izquierdo y el tobillo con la mano opuesta.  Con las caderas y los hombros bien apoyados, tire suavemente de la rodilla Sales executive / izquierdo y Photographer parte baja de la pierna hacia el hombro opuesto hasta que sienta un estiramiento en las nalgas.  Mantenga esta posicin durante __________ segundos. Reptalo __________ veces. Realice este estiramiento __________ [place] por da. ESTIRAMIENTO - banda ileotibial  En el suelo o cama, recustese de lado de modo que la pierna derecha / izquierdo [place]. Incline su rodilla y tmese del tobillo.  Lleve lentamente la rodilla hacia atrs, hasta que el muslo est en lnea con el tronco. Mantenga el taln en las nalgas y arquee suavemente hacia atrs la cabeza, con hombros y caderas alineados.  Baje suavemente la pierna hasta que la rodilla est cerca del suelo o cama hasta sentir un ligero estiramiento en la zona externa del muslo derecha / izquierdo. Si no siente un estiramiento y la rodilla no  queda cerca del suelo, coloque el taln del otro pie por arriba de la rodilla y empuje la rodilla y el muslo un poco ms hacia abajo.  Mantenga esta posicin durante __________ segundos. Reptalo __________ veces. Realice este ejercicio __________ veces por da. EJERCICIOS DE FORTALECIMIENTO - Sndrome del glteo medio  Estos ejercicios lo ayudarn al comienzo de la rehabilitacin. Los sntomas podrn aliviarse con o sin asistencia adicional de su mdico, fisioterapeuta o Herbalist. Al  completar estos ejercicios, recuerde:  Los msculos pueden ganar tanto la resistencia como la fuerza que necesita para sus actividades diarias a travs de ejercicios controlados.  Realice los ejercicios como se lo indic el mdico, el fisioterapeuta o Orthoptist. Aumente la resistencia y las repeticiones segn se le haya indicado.  Podr experimentar dolor o cansancio muscular, pero el dolor o molestia que trata de eliminar a travs de los ejercicios nunca debe empeorar. Si el dolor empeora, detngase y asegrese de que est siguiendo las directivas correctamente. Si an siente dolor luego de Education officer, environmental lo ajustes necesarios, deber discontinuar el ejercicio hasta que pueda conversar con el profesional sobre el problema. FUERZA Extensores de la cadera - puente  Recustese sobre su espalda en una superficie firme. Doble las rodillas y [employer] pies planos sobre el piso.  Tensione los [employer] de las nalgas y [employer] que el tronco quede a nivel de los muslos. Debe sentir que [employer] de las nalgas y la espalda. Si no siente los msculos, deslice los pies hasta separarlos 2,5 a 5cm ms de las nalgas.  Mantenga esta posicin durante __________ segundos.  Baje las caderas lentamente hasta la posicin inicial y deje que los msculos de las nalgas se relajen completamente antes de repetir.  Si este ejercicio le resulta demasiado fcil, puede cruzar los brazos [place]. Reptalo __________ veces. Realice este ejercicio __________ veces por da.  FUERZA  Aductores de la cadera, elevacin de la pierna extendida Vigile su posicin durante todo el ejercicio de modo que est seguro que trabajan los msculos correctos. Si lo hace de manera descuidada, no estar fortaleciendo los msculos correctos.  Descanse sobre un lado de modo que su cabeza, hombros, rodilla y cadera queden alineados. Puede doblar la rodilla que est por debajo para mantener el equilibrio. Su pierna derecha /  izquierdo debe [place].  Ruede con la cadera [employer], de modo que ambas queden lineadas y su rodilla derecha / izquierdo quede hacia adelante.  Levante 10 a 15 cm la pierna que qued [place], haciendo fuerza con el taln. Asegrese que el pie no se vaya hacia adelante ni que la rodilla ruede [place].  Mantenga esta posicin durante __________ segundos. Debe sentir que los msculos de la cadera se elevan (podr no notarlo hasta que comience a sentir cansancio en la pierna.  Baje lentamente la pierna hasta la posicin inicial. Permita que los msculos se relajen completamente antes de repetir. Reptalo __________ veces. Realice este ejercicio __________ veces por da.  FUERZA  Abductores de cadera, cuadrpedo  [employer] y las rodillas en una superficie acolchada. Las manos deben estar directamente sobre los hombros y las rodillas debajo de las caderas.  Mantenga la rodilla derecha / izquierdo doblada, levante y lleve la pierna hacia un lado. Mantenga el nivel de las piernas en lnea con los hombros.  Mantenga esta posicin durante __________ segundos.  Mantenga el tronco firme y las caderas niveladas, y baje lentamente la pierna hasta la posicin inicial.  Reptalo __________ veces. Realice este ejercicio __________ veces por da.  FUERZA  Abductores de cadera, de pie Vigile su posicin durante todo el ejercicio de modo que est seguro que trabajan los msculos correctos. Si lo hace de manera descuidada, no estar fortaleciendo los msculos correctos. Asegure un extremo de una banda de goma para ejercicios a un objeto fijo (mesa, columna) y haga un lazo en el otro extremo.  Coloque el lazo alrededor de su tobillo derecha / izquierdo. Coloque el cuerpo de lado para que el lado opuesto enfrente la mesa o poste y la pierna derecha / izquierdo quede lejos de sta. Aljese de la mesa o la columna, hasta que la banda se tense.  Si es necesario, sostngase de una  silla para mantener el equilibrio.  Con la espalda erguida, los hombros [employer] caderas y los dedos de los pies hacia adelante, levante la pierna derecha / izquierdo hacia un lado. Asegrese de Printmaker fuerza con los msculos de las caderas. No "lance" la pierna ni incline el cuerpo para levantarla.  De manera controlada, vuelva lentamente a la posicin inicial. Repita el ejercicio __________ veces. Realice este ejercicio __________ veces por da.  Document Released: 07/18/2006 Document Revised: 12/24/2011 [facility] Patient Information 2014 [place], [place].

## 2013-09-28 NOTE — Progress Notes (Signed)
Interpreter Morley Gaumer [facility] 

## 2013-12-07 ENCOUNTER — Encounter (HOSPITAL_COMMUNITY): Payer: Self-pay | Admitting: Emergency Medicine

## 2013-12-07 ENCOUNTER — Emergency Department (INDEPENDENT_AMBULATORY_CARE_PROVIDER_SITE_OTHER)
Admission: EM | Admit: 2013-12-07 | Discharge: 2013-12-07 | Disposition: A | Discharge status: Home / Self Care | Payer: Self-pay | Source: Home / Self Care | Attending: Emergency Medicine | Admitting: Emergency Medicine

## 2013-12-07 DIAGNOSIS — IMO0002 Reserved for concepts with insufficient information to code with codable children: Secondary | ICD-10-CM

## 2013-12-07 DIAGNOSIS — T148XXA Other injury of unspecified body region, initial encounter: Secondary | ICD-10-CM

## 2013-12-07 DIAGNOSIS — Z23 Encounter for immunization: Secondary | ICD-10-CM

## 2013-12-07 MED ORDER — TETANUS-DIPHTH-ACELL PERTUSSIS 5-2.5-18.5 LF-MCG/0.5 IM SUSP
0.5000 mL | Freq: Once | INTRAMUSCULAR | Status: AC
  Administered 2013-12-07: 0.5 mL via INTRAMUSCULAR

## 2013-12-07 MED ORDER — TETANUS-DIPHTH-ACELL PERTUSSIS 5-2.5-18.5 LF-MCG/0.5 IM SUSP
INTRAMUSCULAR | Status: AC
  Filled 2013-12-07: qty 0.5

## 2013-12-07 NOTE — ED Notes (Signed)
Pt reports   Laceration  To  l  Leg    With  Sharp  Metal  Today    Bleeding   Stopped        The  Injury  Above the  l  Knee

## 2013-12-07 NOTE — ED Provider Notes (Signed)
  Chief Complaint   Chief Complaint  Patient presents with  . Laceration    History of Present Illness   Frank Wilcox is a 33 year old male who lacerated his left thigh on a piece of metal at work today. Bleeding is controlled. He denies any numbness or tingling. He cannot recall when his last tetanus shot was.  Review of Systems   Other than as noted above, the patient denies any of the following symptoms: Musculoskeletal:  No joint pain or decreased range of motion. Neuro:  No numbness, tingling, or weakness.  PMFSH   Past medical history, family history, social history, meds, and allergies were reviewed.   Physical Examination     Vital signs:  BP 120/70  Pulse 78  Temp(Src) 98.6 F (37 C) (Oral)  Resp 16  SpO2 100% Ext:  There is a 2.5 cm laceration on the left anterior thigh, just above the knee, bleeding is controlled, there is some dirt contamination, but no foreign body.  All other joints had a full ROM without pain.  Pulses were full.  Good capillary refill in all digits.  No edema. Neurological:  Alert and oriented.  No muscle weakness.  Sensation was intact to light touch.   Procedure   Verbal informed consent was obtained.  The patient was informed of the risks and benefits of the procedure and understands and accepts.  A time out was called and the identity of the patient and correct procedure were confirmed.   The laceration area described above was prepped with Betadine and saline, copiously irrigated with saline,  and anesthetized with 5 mL of 2% Xylocaine  With epinephrine.  The wound was then closed as follows:  The contaminated tissues were snipped out, and the laceration was closed with 5 5-0 nylon sutures.  There were no immediate complications, and the patient tolerated the procedure well. The laceration was then cleansed, Bacitracin ointment was applied and a clean, dry pressure dressing was put on.   Course in Urgent Care Center   Given a  Tdap vaccine.  Assessment   The encounter diagnosis was Laceration.  Plan   1.  Meds:  The following meds were prescribed:   Discharge Medication List as of 12/07/2013  3:16 PM      2.  Patient Education/Counseling:  The patient was given appropriate handouts, self care instructions, and instructed in symptomatic relief. Instructions were given for wound care.    3.  Follow up:  The patient was told to follow up immediately if there is any sign of infection.The patient will return in 10 days for suture removal.      Reuben Likesavid C Joahan Swatzell, MD 12/07/13 [phone]

## 2013-12-07 NOTE — Discharge Instructions (Signed)
Cuidados de una laceración - Adultos  °(Laceration Care, Adult) ° Una herida cortante es un corte o lesión que atraviesa todas las capas de la piel y el tejido que se encuentra debajo de la piel.  °TRATAMIENTO  °Algunas laceraciones no requieren sutura. Algunas no deben cerrarse debido a que puede aumentar el riesgo de infección. Es importante que consulte al médico lo antes posible después de recibir una lesión para minimizar el riesgo de infección y aumentar la posibilidad de que se cierre con éxito.  °Cuando se cierra adecuadamente, podrán indicarle analgésicos, si los necesita. La herida debe limpiarse para combatir la infección. El médico usará puntos (suturas), grapas,adhesivo, o tiras adhesivas para reparar la laceración. Estos elementos mantendrán unidos los bordes de la piel para que se cure más rápidamente y para un mejor resultado cosmético. Sin embargo, todas las heridas se curarán con una cicatriz. Una vez que la herida se haya curado, las cicatrices pueden minimizarse cubriendo la herida con pantalla solar durante el día por un lapso se 1 año.  °INSTRUCCIONES PARA EL CUIDADO EN EL HOGAR  °Si tiene puntos o grapas:  °· Mantenga la herida limpia y seca. °· Si tiene un (vendaje) cámbielo al menos una vez al día. Cámbielo si se moja o se ensucia, o según las indicaciones del médico. °· Lave el corte dos veces por día con agua y jabón. Enjuáguelo con agua para quitar todo el jabón. Seque dando palmaditas con una toalla limpia y seca. °· Después de limpiar, aplique una delgada capa de una crema con antibiótico según las indicaciones del médico. Esto le ayudará a prevenir las infecciones y a evitar que el vendaje se adhiera. °· Puede ducharse después de las primeras 24 horas. No remoje la herida en agua hasta que le hayan quitado los puntos. °· Solo tome medicamentos que se pueden comprar sin receta o recetados para el dolor, malestar o fiebre, como le indica el médico. °· Concurra para que le retiren los  puntos o las grapas cuando el médico le indique. °En caso que tenga tiras adhesivas:  °· Mantenga la herida limpia y seca. °· No deje que las tiras se mojen. Puede darse un baño cuidando de mantener la herida seca. °· Si se moja, séquela dando palmaditas con una toalla limpia. °· Las tiras caerán por sí mismas. Puede recortar las tiras a medida que la herida se cura. No quite las tiras que están pegadas a la herida. Ellas se caerán cuando sea el momento. °En caso que le hayan aplicado adhesivo.  °· Podrá mojara momentáneamente la herida en la ducha o el baño. No frote ni sumerja la herida. No practique natación. Evite transpirar con abundancia hasta que el adhesivo se haya caído. Después de ducharse o darse un baño, seque el corte dando palmaditas con una toalla limpia. °· No aplique medicamentos líquidos, en crema o ungüentos mientras el adhesivo esté en su lugar. Podrá aflojarlo antes de que la herida se cure. °· Si tiene un vendaje, tenga cuidado de no aplicar cinta adhesiva directamente sobre el adhesivo. Esto puede hacer que el adhesivo se caiga antes de que la herida se haya curado. °· Evite la exposición prolongada a la luz del sol o a la lámpara solar mientras en adhesivo se encuentre en el lugar. La exposición a los rayos ultravioletas durante el primer año oscurecerá la cicatriz. °· El adhesivo permanecerá sobre la piel durante 5 a 10 días y luego caerá naturalmente. No quite la película de adhesivo. °Deberá aplicarse   El adhesivo permanecerá sobre la piel durante 5 a 10 días y luego caerá naturalmente. No quite la película de adhesivo.  Deberá aplicarse la vacuna contra el tétanos si:  · No recuerda cuándo se colocó la vacuna la última vez.  · Nunca recibió esta vacuna.  Si le han aplicado la vacuna contra el tétanos, el brazo podrá hincharse, enrojecer y sentirse caliente al tacto. Esto es frecuente y no es un problema. Si usted necesita aplicarse la vacuna y se niega a recibirla, corre riesgo de contraer tétanos. Ésta es una enfermedad grave.   SOLICITE ATENCIÓN MÉDICA SI:   · Presenta enrojecimiento, hinchazón o aumento del dolor en la  herida.  · Hay rayas rojas que salen de la herida.  · Observa un líquido blanco amarillento (pus) en la herida.  · Tiene fiebre.  · Advierte un olor fétido que proviene de la herida o del vendaje.  · La herida se abre luego de que le han extraído las suturas.  · Nota que en la herida hay algún cuerpo extraño como un trozo de madera o vidrio.  · La herida está en su mano o pie y observa que no puede mover correctamente los dedos.  SOLICITE ATENCIÓN MÉDICA DE INMEDIATO SI:   · El dolor no se alivia con los medicamentos.  · Hay una zona muy hinchada alrededor de la herida que le causa dolor y adormecimiento, o advierte un cambio en el color en el brazo, la mano, la pierna o el pie.  · La herida se abre y sangra nuevamente.  · Siente que el adormecimiento, la debilidad o la pérdida de la función de la articulación que rodea la herida empeoran.  · Palpa nódulos dolorosos cerca de la herida o bajo la piel en cualquier zona del cuerpo.  ASEGÚRESE DE QUE:   · Comprende estas instrucciones.  · Controlará su enfermedad.  · Solicitará ayuda de inmediato si no mejora o si empeora.  Document Released: 10/01/2005 Document Revised: 12/24/2011  ExitCare® Patient Information ©2014 [employer].

## 2014-02-20 ENCOUNTER — Encounter (HOSPITAL_COMMUNITY): Payer: Self-pay | Admitting: Emergency Medicine

## 2014-02-20 ENCOUNTER — Emergency Department (INDEPENDENT_AMBULATORY_CARE_PROVIDER_SITE_OTHER)
Admission: EM | Admit: 2014-02-20 | Discharge: 2014-02-20 | Disposition: A | Discharge status: Home / Self Care | Payer: No Typology Code available for payment source | Source: Home / Self Care | Attending: Emergency Medicine | Admitting: Emergency Medicine

## 2014-02-20 DIAGNOSIS — M545 Low back pain, unspecified: Secondary | ICD-10-CM

## 2014-02-20 LAB — POCT URINALYSIS DIP (DEVICE)
Bilirubin Urine: NEGATIVE
Glucose, UA: NEGATIVE mg/dL
HGB URINE DIPSTICK: NEGATIVE
Ketones, ur: NEGATIVE mg/dL
Leukocytes, UA: NEGATIVE
NITRITE: NEGATIVE
PH: 6.5 (ref 5.0–8.0)
Protein, ur: NEGATIVE mg/dL
SPECIFIC GRAVITY, URINE: 1.01 (ref 1.005–1.030)
UROBILINOGEN UA: 0.2 mg/dL (ref 0.0–1.0)

## 2014-02-20 MED ORDER — METHOCARBAMOL 500 MG PO TABS: 500.0000 mg | ORAL_TABLET | Freq: Three times a day (TID) | ORAL | Status: DC

## 2014-02-20 MED ORDER — TRAMADOL HCL 50 MG PO TABS: 100.0000 mg | ORAL_TABLET | Freq: Three times a day (TID) | ORAL | Status: DC | PRN

## 2014-02-20 NOTE — Discharge Instructions (Signed)
Ejercicios para la espalda  (Back Exercises)  Estos ejercicios ayudan a tratar y prevenir lesiones en la espalda. El objetivo es aumentar la fuerza de los músculos abdominales y dorsales y la flexibilidad de la espalda. Debe comenzar con estos ejercicios cuando ya no tenga dolor. Los ejercicios para la espalda incluyen:  · Inclinación de la pelvis - Recuéstese sobre la espalda con las rodillas flexionadas. Incline la pelvis hasta que la parte inferior de la espalda se apoye en el piso. Mantenga esta posición durante 5 a 10 segundos y repita entre 5 y 10 veces.  · Rodilla al pecho  Empuje primero una rodilla contra el pecho y mantenga durante 20 a 30 segundos; repita con la otra rodilla y luego con ambas a la vez. Esto puede realizarlo con la otra pierna extendida o flexionada, del modo en que se sienta más cómodo.  · Abdominales o despegar el cóccix del suelo empleando la musculatura abdominal  Flexione las rodillas 90 grados. Comience inclinando la pelvis y realice un ejercicio abdominal lento y parcial, elevando el tronco sólo entre 30 y 45 grados del suelo. Emplee al menos entre 2 y 3 segundos para cada abdominal. No realice los abdominales con las rodillas extendidas. Si le resulta difícil realizar abdominales parciales, simplemente haga lo que se explicó anteriormente, pero sólo contraiga los músculos abdominales y manténgalos tal como se le ha indicado.  · Inclinación de la cadera - Recuéstese sobre la espalda con las rodillas flexionadas a 90 grados. Empújese con los pies y los hombros mientras eleva la cadera un par de centímetros del suelo, mantenga durante 10 segundos y repita entre 5 y 10 veces.  · Arcos dorsales  Acuéstese sobre el estómago e impulse el tronco hacia atrás sobre los codos flexionados. Presione lentamente con las manos, formando un arco con la zona inferior de la espalda. Repita entre 3 y 5 veces. Al realizar las repeticiones, luego de un tiempo disminuirán la rigidez y las  molestias.  · Elevación de los hombros  Acuéstese hacia abajo con los brazos a los lados del cuerpo. Presione las caderas y el torso contra el suelo mientras eleva lentamente la cabeza y los hombros del suelo.  No exagere con los ejercicios, especialmente en el comienzo. Los ejercicios pueden causar alguna molestia leve en la espalda durante algunos minutos; sin embargo, si el dolor es muy intenso, o dura más de 15 minutos, no siga con la actividad física hasta que consulte al profesional que lo asiste. Los problemas en la espalda mejoran de manera lenta con esta terapia.   Consulte al profesional para que lo ayude a planificar un programa de ejercicios adecuado para su espalda.  Document Released: 10/01/2005 Document Revised: 12/24/2011  ExitCare® Patient Information ©2014 [employer].

## 2014-02-20 NOTE — ED Notes (Signed)
Interpretor  Phone  Placed  In  Room

## 2014-02-20 NOTE — ED Notes (Signed)
Pt    Reports  Back  Pain     X  4  Days    -  denys  Any  specefic  Recent  Injury   -  Sitting  Upright  On  The  Exam  Table  And  Is  Speaking  In  Complete  sentances          He     Does    Reports   Some    Pain on  Urination  However

## 2014-02-20 NOTE — ED Provider Notes (Signed)
Chief Complaint   Chief Complaint  Patient presents with  . 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 He denies any pain with bending, lifting, or hard work. There has been no numbness, tingling, or weakness in his lower extremities. He did have slight burning with urination. Denies any frequency, urgency, hematuria, or incontinence of bladder or bowel. He's had no saddle anesthesia. He denies any injury to his back. He has not had back problems before.  Review of Systems   Other than as noted above, the patient denies any of the following symptoms: Systemic:  No fever, chills, or unexplained weight loss. GI:  No abdominal painor incontinence of bowel. GU:  No dysuria, frequency, urgency, or hematuria. No incontinence of urine or urinary retention.  M-S:  No neck pain or arthritis. Neuro:  No paresthesias, headache, saddle anesthesia, muscular weakness, or progressive neurological deficit.  PMFSH   Past medical history, family history, social history, meds, and allergies were reviewed. Specifically, there is no history of cancer, major trauma, osteoporosis, immunosuppression, or HIV infection.   Physical Examination    Vital signs:  BP 117/65  Pulse 83  Temp(Src) 98.5 F (36.9 C) (Oral)  Resp 16  SpO2 100% General:  Alert, oriented, in no distress. Abdomen:  Soft, non-tender.  No organomegaly or mass.  No pulsatile midline abdominal mass or bruit. Back:  There is mild pain to palpation in the mid lumbar spine. His back has a full range of motion with minimal pain. Straight leg raising was negative. Neuro:  Normal muscle strength, sensations and DTRs. Extremities: Pedal pulses were full, there was no edema. Skin:  Clear, warm and dry.  No rash.  Labs   Results for orders placed during the hospital encounter of 02/20/14  POCT  URINALYSIS DIP (DEVICE)      Result Value Ref Range   Glucose, UA NEGATIVE  NEGATIVE mg/dL   Bilirubin Urine NEGATIVE  NEGATIVE   Ketones, ur NEGATIVE  NEGATIVE mg/dL   Specific Gravity, Urine 1.010  1.005 - 1.030   Hgb urine dipstick NEGATIVE  NEGATIVE   pH 6.5  5.0 - 8.0   Protein, ur NEGATIVE  NEGATIVE mg/dL   Urobilinogen, UA 0.2  0.0 - 1.0 mg/dL   Nitrite NEGATIVE  NEGATIVE   Leukocytes, UA NEGATIVE  NEGATIVE     Assessment   The encounter diagnosis was Lumbago.  No evidence of cauda equina syndrome.  Plan     1.  Meds:  The following meds were prescribed:   Discharge Medication List as of 02/20/2014  3:57 PM    START taking these medications   Details  methocarbamol (ROBAXIN) 500 MG tablet Take 1 tablet (500 mg total) by mouth 3 (three) times daily., Starting 02/20/2014, Until Discontinued, Normal    traMADol (ULTRAM) 50 MG tablet Take 2 tablets (100 mg total) by mouth every 8 (eight) hours as needed., Starting 02/20/2014, Until Discontinued, Normal        2.  Patient Education/Counseling:  The patient was given appropriate handouts, self care instructions, and instructed in symptomatic relief. The patient was encouraged to try to be as active as possible and given some exercises to do followed by moist heat.  3.  Follow up:  The patient was told to follow up here if no better in 3 to 4 days, or  sooner if becoming worse in any way, and given some red flag symptoms such as worsening pain or new neurological symptoms which would prompt immediate return.  Follow up here as necessary.     Reuben Likesavid C Lagena Strand, MD 02/20/14 [phone]

## 2014-04-15 ENCOUNTER — Other Ambulatory Visit: Payer: Self-pay | Admitting: Family Medicine

## 2014-04-15 ENCOUNTER — Ambulatory Visit (INDEPENDENT_AMBULATORY_CARE_PROVIDER_SITE_OTHER): Payer: No Typology Code available for payment source | Admitting: Family Medicine

## 2014-04-15 DIAGNOSIS — R1012 Left upper quadrant pain: Secondary | ICD-10-CM

## 2014-04-15 DIAGNOSIS — Z0000 Encounter for general adult medical examination without abnormal findings: Secondary | ICD-10-CM

## 2014-04-15 NOTE — Assessment & Plan Note (Signed)
Most likely has been having acute pancreatitis flares. Patient has not had a drink in 5 days. No withdrawal symptoms noted. Patient wants to quit so will have CSW provide materials to patient. Red flags reviewed for prompt evaluation at an ED.

## 2014-04-15 NOTE — Patient Instructions (Signed)
Frank Wilcox, it was a pleasure seeing you today. Today we talked about your abdominal pain. This is pain could be short episodes of pancreatitis cause by your drinking. I'm glad that you want to stop drinking. This is probably the best thing to help your pain. I will have our clinical social worker get in touch with you regarding resources.  If you have symptoms of severe abdominal pain, severe nausea/vomiting, blood stools, tremors or seizures, please go directly to the emergency department.  Please make an appointment to see me in 4 weeks for a yearly physical.  If you have any questions or concerns, please do not hesitate to call the office at [phone].  Sincerely,  Jacquelin Hawkingalph Gem Conkle, MD  Cmo buscar tratamiento para la adiccin al alcohol y a las drogas  (Finding Treatment for Alcohol and Drug Addiction) Puede ser difcil encontrar el lugar adecuado para recibir Ambulance persontratamiento profesional. Aqu le indicamos algunos factores importantes para que tenga en cuenta.   Hay diferentes tipos de tratamiento para elegir.  Algunos programas se realizan con internacin (en hogares especiales) y otros no (como paciente ambulatorio). En algunos casos se ofrece una combinacin de Kindred Healthcareambos programas.  No hay un nico tipo de programa que sea adecuado para todos.  La mayor parte de los programas de tratamiento incluyen una combinacin de psicoterapia, educacin y un abordaje de 12 pasos basado en la espiritualidad.  Hay programas que no estn basados en la espiritualidad (no tienen 12 pasos).  Algunos programas de tratamiento estn patrocinados por el gobierno. Estn dirigidos a pacientes que no tienen seguros privados.  Los programas de tratamiento pueden variar en muchos aspectos, como:  Costos y tipos de seguro aceptados.  Tipos de servicios mdicos ofrecidos en el lugar.  Duracin de Engineer, waterla internacin, tipo y Writercaractersticas del establecimiento.  Filosofa general del  tratamiento. Burkina Fasona persona puede necesitar un tratamiento especializado o tener necesidades que no estn contempladas en todos los programas. Por ejemplo, los adolescentes necesitan un tratamiento apropiado para su edad. Otras personas tienen trastornos secundarios que tambin deben ser tratados. Las enfermedades secundarias pueden ser enfermedades mentales, como la depresin, u otras enfermedades como la diabetes. Generalmente es necesario pasar por un perodo de desintoxicacin del alcohol o las drogas. Esto requiere supervisin mdica y no todos los programas pueden [address]ofrecerla.  QU SE DEBE CONSIDERAR ANTES DE SELECCIONAR UN PROGRAMA DE TRATAMIENTO:   El programa est certificado por la agencia de gobierno correspondiente? An los programas privados deben estar certificados y emplear profesionales certificados.  El programa est aceptado por su seguro mdico? En caso contrario, puede establecerse un plan de pagos?  Las instalaciones, estn limpias, organizadas y bien administradas? Le permiten hablar con personas que hayan obtenido el alta mdica y puedan compartir su experiencia con usted? Puede visitar las instalaciones? Puede conocer al personal?  El programa contempla todos los aspectos de las necesidades individuales?  El programa de tratamiento tiene en cuenta la orientacin sexual y las discapacidades fsicas? Ofrecen servicios adecuados para las diferencias de [place]uedad, gnero y cultura?  Es un tratamiento disponible en otros idiomas que no sea ingls?  Se alienta o proporciona un apoyo o gua de cuidados posteriores a largo plazo?  Se realiza una valoracin continua de el plan de tratamiento del individuo para asegurarse que conoce sus necesidades de cambio?  El programa utiliza estrategias para Air cabin crewalentar a los pacientes reacios a que puedan seguir un tratamiento de duracin suficiente que aumente la probabilidad de xito?  El programa ofrece psicoterapia (  individual o  grupal) u otras terapias conductuales?  Ofrece el programa medicacin como parte del rgimen de Ski Gaptratamiento, si fuera necesario?  Hay un control continuo de las posibles recadas? Tienen definido un programa de prevencin de recadas? Se ofrecen servicios a los miembros de la familia para asegurar que comprenden la adiccin y el proceso de recuperacin? Esto ayudara a Biomedical scientistsostener la recuperacin individual.  Los encuentros en 12 pasos se llevan a cabo en el centro, o hay un transporte disponible para los pacientes que deban asistir a reuniones externas? En pases fuera de los [address]ados Un[place] y [place], vea las guas locales para obtener la informacin de contacto de los servicios para cada rea.  Document Released: 09/13/2008 Document Revised: 12/24/2011 [facility] Patient Information 2015 [place]ExitCare, MarylandLLC. This information is not intended to replace advice given to you by your health care provider. Make sure you discuss any questions you have with your health care provider.

## 2014-04-15 NOTE — Progress Notes (Signed)
   Subjective:    Patient ID: Aliene BeamsUlises Castillo-Guevara, male    DOB: July 07, 1981, 33 y.o.   MRN: [id number]  HPI  Patient presents today with complaint of abdominal pain while drinking alcohol. He currently has no pain. When he does have pain, it is located LUQ and radiates to his back. No nausea or vomiting. No hematemesis. No tremors or seizures. He drinks about 6 cans of beer daily. He would like to quit and would like resources. His last drink was Friday, June 26th.   C: positive A: positive G: positive E: negative  Review of Systems  Gastrointestinal: Negative for nausea and vomiting.  Neurological: Negative for tremors and seizures.  All other systems reviewed and are negative.      Objective:   Physical Exam  Vitals reviewed. Constitutional: He is oriented to person, place, and time. He appears well-developed and well-nourished.  Pulmonary/Chest: Effort normal.  Abdominal: Soft. Bowel sounds are normal. He exhibits no distension. There is no tenderness. There is no rebound and no guarding.  Neurological: He is alert and oriented to person, place, and time.          Assessment & Plan:

## 2014-04-29 ENCOUNTER — Other Ambulatory Visit (INDEPENDENT_AMBULATORY_CARE_PROVIDER_SITE_OTHER): Payer: No Typology Code available for payment source

## 2014-04-29 DIAGNOSIS — R1012 Left upper quadrant pain: Secondary | ICD-10-CM

## 2014-04-29 DIAGNOSIS — Z0000 Encounter for general adult medical examination without abnormal findings: Secondary | ICD-10-CM

## 2014-04-29 LAB — COMPREHENSIVE METABOLIC PANEL
ALK PHOS: 55 U/L (ref 39–117)
ALT: 17 U/L (ref 0–53)
AST: 19 U/L (ref 0–37)
Albumin: 4.2 g/dL (ref 3.5–5.2)
BILIRUBIN TOTAL: 0.8 mg/dL (ref 0.2–1.2)
BUN: 17 mg/dL (ref 6–23)
CO2: 25 meq/L (ref 19–32)
CREATININE: 0.75 mg/dL (ref 0.50–1.35)
Calcium: 8.9 mg/dL (ref 8.4–10.5)
Chloride: 105 mEq/L (ref 96–112)
GLUCOSE: 87 mg/dL (ref 70–99)
Potassium: 4.1 mEq/L (ref 3.5–5.3)
SODIUM: 137 meq/L (ref 135–145)
TOTAL PROTEIN: 6.7 g/dL (ref 6.0–8.3)

## 2014-04-29 LAB — LIPID PANEL
CHOL/HDL RATIO: 2.7 ratio
CHOLESTEROL: 161 mg/dL (ref 0–200)
HDL: 59 mg/dL (ref >39)
LDL Cholesterol: 89 mg/dL (ref 0–99)
TRIGLYCERIDES: 65 mg/dL (ref <150)
VLDL: 13 mg/dL (ref 0–40)

## 2014-04-29 LAB — CBC WITH DIFFERENTIAL/PLATELET
BASOS PCT: 1 % (ref 0–1)
Basophils Absolute: 0.1 10*3/uL (ref 0.0–0.1)
EOS ABS: 0.1 10*3/uL (ref 0.0–0.7)
EOS PCT: 1 % (ref 0–5)
HCT: 43.2 % (ref 39.0–52.0)
HEMOGLOBIN: 15.4 g/dL (ref 13.0–17.0)
LYMPHS ABS: 2.2 10*3/uL (ref 0.7–4.0)
Lymphocytes Relative: 34 % (ref 12–46)
MCH: 32.4 pg (ref 26.0–34.0)
MCHC: 35.6 g/dL (ref 30.0–36.0)
MCV: 90.8 fL (ref 78.0–100.0)
MONOS PCT: 9 % (ref 3–12)
Monocytes Absolute: 0.6 10*3/uL (ref 0.1–1.0)
NEUTROS PCT: 55 % (ref 43–77)
Neutro Abs: 3.5 10*3/uL (ref 1.7–7.7)
PLATELETS: 202 10*3/uL (ref 150–400)
RBC: 4.76 MIL/uL (ref 4.22–5.81)
RDW: 13.6 % (ref 11.5–15.5)
WBC: 6.4 10*3/uL (ref 4.0–10.5)

## 2014-04-29 LAB — POCT GLYCOSYLATED HEMOGLOBIN (HGB A1C): Hemoglobin A1C: 4.8

## 2014-04-29 LAB — LIPASE: Lipase: 21 U/L (ref 0–75)

## 2014-04-29 NOTE — Progress Notes (Signed)
CSW received a referral for substance abuse resources. CSW has contacted pt in the past and provided CSW number - pt at that time agreed to contact CSW back regarding resources however did not. CSW will attempt to meet with pt at his next appt with PCP.  Theresia BoughNorma Kermit Arnette, MSW, LCSW [phone]

## 2014-04-29 NOTE — Progress Notes (Signed)
CBC WITH DIFF,CMP,LIPASE,FLP AND A1C DONE TODAY Frank Wilcox

## 2014-05-21 ENCOUNTER — Ambulatory Visit (INDEPENDENT_AMBULATORY_CARE_PROVIDER_SITE_OTHER): Payer: No Typology Code available for payment source | Admitting: Family Medicine

## 2014-05-21 VITALS — BP 110/60 | HR 69 | Temp 98.2°F | Ht 69.0 in | Wt 200.2 lb

## 2014-05-21 DIAGNOSIS — Z0000 Encounter for general adult medical examination without abnormal findings: Secondary | ICD-10-CM

## 2014-05-21 DIAGNOSIS — K2100 Gastro-esophageal reflux disease with esophagitis, without bleeding: Secondary | ICD-10-CM

## 2014-05-21 DIAGNOSIS — K219 Gastro-esophageal reflux disease without esophagitis: Secondary | ICD-10-CM | POA: Insufficient documentation

## 2014-05-21 MED ORDER — OMEPRAZOLE 20 MG PO CPDR: 20.0000 mg | DELAYED_RELEASE_CAPSULE | Freq: Every day | ORAL | Status: DC

## 2014-05-21 NOTE — Progress Notes (Signed)
Subjective:    Patient ID: Frank Wilcox, male    DOB: [birth year], 33 y.o.   MRN: [id number]  HPI  Patient presents for annual exam. Complaints today: Left side epigastric pain. Has history of GERD but stopped taking PPI. Symptoms similar and at the time responded to PPI treatment.  Past Medical History  Diagnosis Date  . Unspecified gastritis and gastroduodenitis without mention of hemorrhage   . Esophageal reflux   . Alcohol abuse   . Anxiety   . Sleep disturbance, unspecified   . S/P laparoscopic appendectomy 06/25/2012   Past Surgical History  Procedure Laterality Date  . Ankle surgery      right  . Laparoscopic appendectomy  06/06/2012    Procedure: APPENDECTOMY LAPAROSCOPIC;  Surgeon: Liz Malady, MD;  Location: [facility] OR;  Service: General;  Laterality: N/A;  . Appendectomy     Family History  Problem Relation Age of Onset  . Diabetes Mother    History  Substance Use Topics  . Smoking status: Former Games developer  . Smokeless tobacco: Never Used  . Alcohol Use: 0.0 oz/week     Comment: occasional   No Known Allergies   Review of Systems  Gastrointestinal: Positive for abdominal pain.       Objective:   Physical Exam  BP 110/60  Pulse 69  Temp(Src) 98.2 F (36.8 C) (Oral)  Ht 5\' 9"  (1.753 m)  Wt 200 lb 3.2 oz (90.81 kg)  BMI 29.55 kg/m2  General Appearance:    Alert, cooperative, no distress, appears stated age  Head:    Normocephalic, without obvious abnormality, atraumatic  Eyes:    PERRL, conjunctiva/corneas clear, EOM's intact       Ears:    Normal TM's and external ear canals, both ears  Nose:   Nares normal, septum midline, mucosa normal, no drainage   or sinus tenderness  Throat:   Lips, mucosa, and tongue normal; teeth and gums normal  Neck:   Supple, symmetrical, trachea midline, no adenopathy;       thyroid:  No enlargement/tenderness/nodules  Lungs:     Clear to auscultation bilaterally, respirations unlabored  Chest wall:    No  tenderness or deformity  Heart:    Regular rate and rhythm, S1 and S2 normal, no murmur, rub   or gallop  Abdomen:     Soft, non-tender, bowel sounds active all four quadrants,    no masses, no organomegaly  Extremities:   Extremities normal, atraumatic, no cyanosis or edema  Pulses:   2+ and symmetric all extremities  Skin:   Skin color, texture, turgor normal, no rashes or lesions  Lymph nodes:   Cervical, supraclavicular nodes normal  Neurologic:   CNII-XII intact. Normal strength, sensation and reflexes      throughout   CBC    Component Value Date/Time   WBC 6.4 04/29/2014 0839   RBC 4.76 04/29/2014 0839   HGB 15.4 04/29/2014 0839   HCT 43.2 04/29/2014 0839   PLT 202 04/29/2014 0839   MCV 90.8 04/29/2014 0839   MCH 32.4 04/29/2014 0839   MCHC 35.6 04/29/2014 0839   RDW 13.6 04/29/2014 0839   LYMPHSABS 2.2 04/29/2014 0839   MONOABS 0.6 04/29/2014 0839   EOSABS 0.1 04/29/2014 0839   BASOSABS 0.1 04/29/2014 0839   CMP     Component Value Date/Time   NA 137 04/29/2014 0839   K 4.1 04/29/2014 0839   CL 105 04/29/2014 0839   CO2 25 04/29/2014 0839  GLUCOSE 87 04/29/2014 0839   BUN 17 04/29/2014 0839   CREATININE 0.75 04/29/2014 0839   CREATININE 0.80 06/06/2012 0336   CALCIUM 8.9 04/29/2014 0839   PROT 6.7 04/29/2014 0839   ALBUMIN 4.2 04/29/2014 0839   AST 19 04/29/2014 0839   ALT 17 04/29/2014 0839   ALKPHOS 55 04/29/2014 0839   BILITOT 0.8 04/29/2014 0839   GFRNONAA >90 06/06/2012 0336   GFRAA >90 06/06/2012 0336   Lipid Panel     Component Value Date/Time   CHOL 161 04/29/2014 0839   TRIG 65 04/29/2014 0839   HDL 59 04/29/2014 0839   CHOLHDL 2.7 04/29/2014 0839   VLDL 13 04/29/2014 0839   LDLCALC 89 04/29/2014 0839      Assessment & Plan:   491. 33 year old male annual physical.  2. GERD - restart omeprazole

## 2014-05-21 NOTE — Patient Instructions (Addendum)
Frank Wilcox, it was a pleasure seeing you today. Today we talked about your overall health. I will prescribe a medication for your reflux  Please see me in 1 year or sooner if needed.  If you have any questions or concerns, please do not hesitate to call the office at [phone].  Sincerely,  Jacquelin Hawkingalph Elody Kleinsasser, MD  Opciones de alimentos para pacientes con reflujo gastroesofgico (Food Choices for Gastroesophageal Reflux Disease) Cuando se tiene reflujo gastroesofgico (ERGE), los alimentos que se ingieren y los hbitos de alimentacin son Engineer, productionmuy importantes. Elegir los alimentos adecuados puede ayudar a Paramedicaliviar las molestias ocasionadas por el [place] HartlandERGE. QU PAUTAS GENERALES DEBO SEGUIR?  Elija las frutas, los vegetales, los cereales integrales, los productos lcteos, la carne de Dardanellevaca, de pescado y de ave con bajo contenido de grasas.  Limite las grasas, [address]como los Cusickaceites, los aderezos para Boswellensalada, la Glen Allenmanteca, los frutos secos y Programme researcher, broadcasting/film/videoel aguacate.  Lleve un registro de las comidas para identificar los alimentos que ocasionan sntomas.  Evite los alimentos que le ocasionen reflujo. Pueden ser distintos para cada persona.  Haga comidas pequeas con frecuencia en lugar de tres comidas OfficeMax Incorporatedabundantes todos los das.  Coma lentamente, en un clima distendido.  Limite el consumo de alimentos fritos.  Cocine los alimentos utilizando mtodos que no sean la fritura.  Evite el consumo alcohol.  Evite beber grandes cantidades de lquidos con las comidas.  Evite agacharse o recostarse hasta despus de 2 o 3horas de haber comido. QU ALIMENTOS NO SE RECOMIENDAN? Los siguientes son algunos alimentos y bebidas que pueden empeorar los sntomas: Veterinary surgeonVegetales Tomates. Jugo de tomate. Salsa de tomate y espagueti. Ajes. Cebolla y [place] Islandajo. Rbano picante. Frutas Naranjas, pomelos y limn (fruta y Sloveniajugo). Carnes Carnes de Ramsayvaca, de pescado y de ave con gran contenido de grasas. Esto incluye los perros  calientes, las Johnsburgcostillas, el Ponca Cityjamn, la salchicha, el salame y el tocino. Lcteos Leche entera y [place] chocolatada. PPG IndustriesCrema cida. Crema. Mantequilla. Helados. Queso crema.  Bebidas Caf y t negro, con o sin cafena Bebidas gaseosas o energizantes. Condimentos Salsa picante. Salsa barbacoa.  Dulces/postres Chocolate y cacao. Rosquillas. Menta y mentol. Grasas y Massachusetts Mutual Lifeaceites Alimentos con alto contenido de grasas, incluidas las papas fritas. Otros Vinagre. Especias picantes, como la Brink's Companypimienta negra, la pimienta blanca, la pimienta roja, la pimienta de cayena, el curry en Milanpolvo, los clavos de Beaverolor, el jengibre y el Arub[place]ile en polvo. Los artculos mencionados arriba pueden no ser Raytheonuna lista completa de las bebidas y los alimentos que se Theatre stage managerdeben evitar. Comunquese con el nutricionista para recibir ms informacin. Document Released: 07/11/2005 Document Revised: 10/06/2013 [facility]Exit[facility] Patient Information 2015 [place]ottExitCare, MarylandLLC. This information is not intended to replace advice given to you by your health care provider. Make sure you discuss any questions you have with your health care provider.

## 2014-05-23 NOTE — Assessment & Plan Note (Signed)
Omeprazole 20mg daily.  °

## 2014-06-09 ENCOUNTER — Other Ambulatory Visit: Payer: Self-pay | Admitting: *Deleted

## 2014-06-09 ENCOUNTER — Other Ambulatory Visit: Payer: Self-pay | Admitting: Family Medicine

## 2014-06-09 MED ORDER — PANTOPRAZOLE SODIUM 40 MG PO TBEC: 40.0000 mg | DELAYED_RELEASE_TABLET | Freq: Every day | ORAL | Status: DC

## 2015-01-17 ENCOUNTER — Ambulatory Visit (INDEPENDENT_AMBULATORY_CARE_PROVIDER_SITE_OTHER): Payer: Self-pay | Admitting: Emergency Medicine

## 2015-01-17 VITALS — BP 100/68 | HR 70 | Temp 98.2°F | Resp 16 | Ht 68.25 in | Wt 195.2 lb

## 2015-01-17 DIAGNOSIS — M545 Low back pain, unspecified: Secondary | ICD-10-CM

## 2015-01-17 DIAGNOSIS — S335XXA Sprain of ligaments of lumbar spine, initial encounter: Secondary | ICD-10-CM

## 2015-01-17 LAB — POCT URINALYSIS DIPSTICK
BILIRUBIN UA: NEGATIVE
Blood, UA: NEGATIVE
GLUCOSE UA: NEGATIVE
LEUKOCYTES UA: NEGATIVE
NITRITE UA: NEGATIVE
Protein, UA: NEGATIVE
Spec Grav, UA: 1.025
UROBILINOGEN UA: 0.2
pH, UA: 5

## 2015-01-17 LAB — POCT UA - MICROSCOPIC ONLY
CASTS, UR, LPF, POC: NEGATIVE
Crystals, Ur, HPF, POC: NEGATIVE
Mucus, UA: NEGATIVE
WBC, Ur, HPF, POC: NEGATIVE
Yeast, UA: NEGATIVE

## 2015-01-17 MED ORDER — CYCLOBENZAPRINE HCL 10 MG PO TABS: 10.0000 mg | ORAL_TABLET | Freq: Three times a day (TID) | ORAL | Status: DC | PRN

## 2015-01-17 MED ORDER — TRAMADOL HCL 50 MG PO TABS: 50.0000 mg | ORAL_TABLET | Freq: Three times a day (TID) | ORAL | Status: DC | PRN

## 2015-01-17 MED ORDER — NAPROXEN SODIUM 550 MG PO TABS: 550.0000 mg | ORAL_TABLET | Freq: Two times a day (BID) | ORAL | Status: DC

## 2015-01-17 NOTE — Progress Notes (Signed)
[facility] [address], [place] [address] [phone]  Date:  01/17/2015   Name:  Frank BeamsUlises Wilcox   DOB:  10-06-1981   MRN:  [id number]  PCP:  Jacquelin HawkingNettey, Ralph, MD    Chief Complaint: Back Pain   History of Present Illness:  Frank BeamsUlises Wilcox is a 34 y.o. very pleasant male patient who presents with the following:  Patient works with sheet rock  Has left lower back pain not radiating No associated neuro symptoms No history of injury or overuse. Is concerned that he has injured the kidney bu drinking a gallon of water. No dysuria, urgency or frequency no hematuria No improvement with over the counter medications or other home remedies.  Denies other complaint or health concern today.   Patient Active Problem List   Diagnosis Date Noted  . GERD (gastroesophageal reflux disease) 05/21/2014  . Abdominal pain when drinking alcohol 04/15/2014  . Hip pain, bilateral 09/28/2013  . Alcoholism 06/22/2013  . Orthostasis 04/03/2013  . Back pain 01/14/2013  . Lateral epicondylitis  of elbow 07/24/2012  . Healthcare maintenance 07/24/2012  . Obesity (BMI 30-39.9) 07/24/2012  . Dyspepsia 01/09/2012    Past Medical History  Diagnosis Date  . Unspecified gastritis and gastroduodenitis without mention of hemorrhage   . Esophageal reflux   . Alcohol abuse   . Anxiety   . Sleep disturbance, unspecified   . S/P laparoscopic appendectomy 06/25/2012    Past Surgical History  Procedure Laterality Date  . Ankle surgery      right  . Laparoscopic appendectomy  06/06/2012    Procedure: APPENDECTOMY LAPAROSCOPIC;  Surgeon: Liz MaladyBurke E Thompson, MD;  [facility];  Service: General;  Laterality: N/A;  . Appendectomy      History  Substance Use Topics  . Smoking status: Former Games developermoker  . Smokeless tobacco: Never Used  . Alcohol Use: 0.0 oz/week     Comment: occasional    Family History  Problem Relation Age of Onset  . Diabetes Mother   .  Hyperlipidemia Father   . Hypertension Father     No Known Allergies  Medication list has been reviewed and updated.  No current outpatient prescriptions on file prior to visit.   No current facility-administered medications on file prior to visit.    Review of Systems:  As per HPI, otherwise negative.    Physical Examination: Filed Vitals:   01/17/15 2015  BP: 100/68  Pulse: 70  Temp: 98.2 F (36.8 C)  Resp: 16   Filed Vitals:   01/17/15 2015  Height: 5' 8.25" (1.734 m)  Weight: 195 lb 4 oz (88.565 kg)   Body mass index is 29.46 kg/(m^2). Ideal Body Weight: Weight in (lb) to have BMI = 25: 165.3  GEN: WDWN, NAD, Non-toxic, A & O x 3 HEENT: Atraumatic, Normocephalic. Neck supple. No masses, No LAD. Ears and Nose: No external deformity. CV: RRR, No M/G/R. No JVD. No thrill. No extra heart sounds. PULM: CTA B, no wheezes, crackles, rhonchi. No retractions. No resp. distress. No accessory muscle use. ABD: S, NT, ND, +BS. No rebound. No HSM. EXTR: No c/c/e NEURO Normal gait.  PSYCH: Normally interactive. Conversant. Not depressed or anxious appearing.  Calm demeanor.  BACK : tender left para spinous muscle.  Neuro intact  Assessment and Plan: Lumbar strain Anaprox Flexeril Ultram  Signed,  Phillips OdorJeffery Anaston Koehn, MD   Results for orders placed or performed in visit on 01/17/15  POCT urinalysis dipstick  Result Value  Ref Range   Color, UA yellow    Clarity, UA clear    Glucose, UA neg    Bilirubin, UA neg    Ketones, UA trace    Spec Grav, UA 1.025    Blood, UA neg    pH, UA 5.0    Protein, UA neg    Urobilinogen, UA 0.2    Nitrite, UA neg    Leukocytes, UA Negative   POCT UA - Microscopic Only  Result Value Ref Range   WBC, Ur, HPF, POC neg    RBC, urine, microscopic 0-1    Bacteria, U Microscopic trace    Mucus, UA neg    Epithelial cells, urine per micros 0-1    Crystals, Ur, HPF, POC neg    Casts, Ur, LPF, POC neg    Yeast, UA neg

## 2015-01-17 NOTE — Patient Instructions (Signed)
Distensin lumbosacra (Lumbosacral Strain) La distensin lumbosacra es una distensin de cualquiera de las partes que componen las vrtebras lumbosacras. Las vrtebras lumbosacras son los huesos que conforman el tercio inferior de la columna vertebral. Estas vrtebras estn sostenidas por msculos y un resistente tejido fibroso (ligamentos).  CAUSAS  Un golpe repentino en la espalda puede provocar una distensin lumbosacra. Adems, cualquier tipo de movimiento que cause una elongacin excesiva de los msculos de la zona lumbar puede provocar este tipo de distensin. Esto se ve normalmente en las personas que se esfuerzan demasiado, se caen, levantan objetos pesados, se agachan o estn en cuclillas con regularidad. FACTORES DE RIESGO  Trabajo agotador.  Participar en deportes en los cuales se deba empujar o tirar y que requieren de un giro repentino de la espalda (tenis, golf, bisbol).  Levantar peso.  Curvatura excesiva de la zona lumbar.  Pelvis hacia adelante.  Espalda o msculos abdominales dbiles, o ambos.  Tendones isquiotibiales tensos. SIGNOS Y SNTOMAS  La distensin lumbosacra puede provocar dolor en la zona de la lesin o un dolor que baja (se extiende) hasta la pierna.  DIAGNSTICO Con frecuencia, el mdico puede diagnosticar una distensin lumbosacra mediante un examen fsico. En algunos casos, es posible que deba realizarse pruebas, como una radiografa.  TRATAMIENTO  El tratamiento para la lesin lumbar depende de muchos factores que el mdico deber evaluar. Sin embargo, la mayora de los tratamientos incluye el uso de antiinflamatorios. INSTRUCCIONES PARA EL CUIDADO EN EL HOGAR   Evite actividades fsicas difciles (tenis, raquetbol, esqu acutico) si no tiene un buen estado fsico para practicarlas. Esto puede agravar o provocar problemas.  Si tiene un problema en la espalda, evite los deportes que requieren de movimientos corporales bruscos. La natacin y las  caminatas son las actividades ms seguras.  Mantenga una buena postura.  Mantenga un peso saludable.  En el caso de episodios agudos, puede colocar hielo en la zona lesionada.  Ponga el hielo en una bolsa plstica.  Coloque una toalla entre la piel y la bolsa de hielo.  Deje el hielo durante 20 minutos, 2 a 3 veces por da.  Cuando la zona lumbar comience a sanar, es posible que le recomienden ejercicios de elongacin y fortalecimiento. SOLICITE ATENCIN MDICA SI:  El dolor de espalda empeora.  Tiene un dolor de espalda intenso que no mejora con medicamentos. SOLICITE ATENCIN MDICA DE INMEDIATO SI:   Siente entumecimiento, hormigueo, debilidad o problemas con el uso de los brazos o las piernas.  Nota cambios en el control de la vejiga o el intestino.  Siente un aumento del dolor en cualquier parte del cuerpo, incluido el vientre (abdomen).  Nota que le falta el aire, se siente mareado o se desmaya.  Tiene malestar estomacal (nuseas), vomita o comienza a sudar.  Nota un cambio de color en los dedos del pie o las piernas, o los pies se ponen muy fros. ASEGRESE DE QUE:   Comprende estas instrucciones.  Controlar su afeccin.  Recibir ayuda de inmediato si no mejora o si empeora. Document Released: 07/11/2005 Document Revised: 10/06/2013 ExitCare Patient Information 2015 [employer]. This information is not intended to replace advice given to you by your health care provider. Make sure you discuss any questions you have with your health care provider.  

## 2015-04-20 ENCOUNTER — Ambulatory Visit: Payer: Self-pay

## 2015-05-09 ENCOUNTER — Ambulatory Visit: Payer: Self-pay

## 2015-06-09 ENCOUNTER — Ambulatory Visit: Payer: Self-pay

## 2015-07-11 ENCOUNTER — Ambulatory Visit: Payer: Self-pay

## 2015-08-23 ENCOUNTER — Encounter: Payer: Self-pay | Admitting: Internal Medicine

## 2015-08-23 ENCOUNTER — Ambulatory Visit (INDEPENDENT_AMBULATORY_CARE_PROVIDER_SITE_OTHER): Payer: Self-pay | Admitting: Internal Medicine

## 2015-08-23 VITALS — BP 118/70 | HR 78 | Ht 67.5 in | Wt 195.0 lb

## 2015-08-23 DIAGNOSIS — K279 Peptic ulcer, site unspecified, unspecified as acute or chronic, without hemorrhage or perforation: Secondary | ICD-10-CM

## 2015-08-23 MED ORDER — OMEPRAZOLE 40 MG PO CPDR: DELAYED_RELEASE_CAPSULE | ORAL | Status: DC

## 2015-08-23 NOTE — Patient Instructions (Addendum)
Drink a glass of water before every meal Drink 6-8 glasses of water daily Eat three meals daily Eat a protein and healthy fat with every meal (eggs,fish, chicken, Malawiturkey and limit red meats) Eat 5 servings of vegetables daily, mix the colors Eat 2 servings of fruit daily with skin, if skin is edible Use smaller plates Put food/utensils down as you chew and swallow each bite Eat at a table with friends/family at least once daily, no TV Do not eat in front of the TV Enfermedad por reflujo gastroesofgico en los adultos (Gastroesophageal Reflux Disease, Adult) Normalmente, los alimentos descienden por el esfago y se depositan en el estmago para su digestin. Si una persona tiene enfermedad por reflujo gastroesofgico (ERGE), los alimentos y el cido estomacal regresan al esfago. Cuando esto ocurre, el esfago se irrita y se hincha (inflama). Con el tiempo, la ERGE puede provocar la formacin de pequeas perforaciones (lceras) en la mucosa del esfago. CUIDADOS EN EL HOGAR Dieta  Siga la dieta como se lo haya indicado el mdico. Tal vez deba evitar los siguientes alimentos y bebidas:  Caf y t (con o sin cafena).  Bebidas que contengan alcohol.  Bebidas energizantes y deportivas.  Gaseosas o refrescos.  Chocolate y cacao.  Menta y esencias de [address] Drmenta.  Ajo y cebollas.  Rbano picante.  Alimentos muy condimentados y cidos, como pimientos, [place] en polvo, curry en polvo, vinagre, salsas picantes y Engineer, watersalsa barbacoa.  Frutas ctricas y sus jugos, como naranjas, limones y limas.  Alimentos a base de tomates, como salsa roja, [place], salsa y pizza con salsa roja.  Alimentos fritos y Lexicographergrasos, como rosquillas, papas fritas y aderezos con alto contenido de Holiday representativegrasa.  Carnes con alto contenido de The Rockgrasa, como hot dogs, filetes de entrecot, salchicha, jamn y tocino.  Productos lcteos con alto contenido de Northwest Ithacagrasa, como Cooksonleche entera, North Irwinmantequilla y queso crema.  Consuma pequeas porciones  de comida con ms frecuencia. Evite consumir porciones abundantes.  Evite beber mucho lquido con las comidas.  No coma durante las 2 o 3horas previas a la hora de Columbusacostarse.  No se acueste inmediatamente despus de comer.  No haga actividad fsica enseguida despus de comer. Instrucciones generales  Est atento a cualquier cambio en los sntomas.  Tome los medicamentos de venta libre y los recetados solamente como se lo haya indicado el mdico. No tome aspirina, ibuprofeno ni otros antiinflamatorios no esteroides (AINE), a menos que el mdico lo autorice.  No consuma ningn producto que contenga tabaco, lo que incluye cigarrillos, tabaco de Theatre managermascar y Administrator, Civil Servicecigarrillos electrnicos. Si necesita ayuda para dejar de fumar, consulte al mdico.  Use ropa suelta. No use nada ajustado alrededor Reynolds Americande la cintura.  Levante (eleve) unas 6pulgadas (15centmetros) la cabecera de la cama.  Intente bajar el nivel de estrs. Si necesita ayuda para hacerlo, consulte al American Expressmdico.  Si tiene sobrepeso, Media planneradelgace hasta alcanzar un peso saludable. Pregntele a su mdico cmo puede perder peso de manera segura.  Concurra a todas las visitas de control como se lo haya indicado el mdico. Esto es importante. SOLICITE AYUDA SI:  Aparecen nuevos sntomas.  Baja de [place]-Alder[place]peso y no sabe por qu.  Tiene dificultad para tragar o siente dolor al Darden Restaurantshacerlo.  Tiene sibilancias o tos que no desaparece.  Los sntomas no mejoran con Scientist, research (medical)el tratamiento.  Tiene la voz ronca. SOLICITE AYUDA DE INMEDIATO SI:  Tiene dolor en los brazos, el cuello, los Colchestermaxilares, la dentadura o la espalda.  Berenice Primasranspira, se marea o tiene sensacin de  desvanecimiento.  Siente falta de aire o Journalist, newspaper.  Vomita y el vmito es parecido a la sangre o a los granos de caf.  Pierde el conocimiento (se desmaya).  Las heces son sanguinolentas o de color negro.  No puede tragar, beber o comer.   Esta informacin no tiene Theme park manager  el consejo del mdico. Asegrese de hacerle al mdico cualquier pregunta que tenga.   Document Released: 11/03/2010 Document Revised: 06/22/2015 Elsevier Interactive Patient Education [employer]

## 2015-08-23 NOTE — Progress Notes (Signed)
   Subjective:    Patient ID: Frank BeamsUlises Castillo-Guevara, male    DOB: 02/26/1981, 34 y.o.   MRN: [id number]  HPI  Lambert ModySharp and burning pains more so in mid epigastrium and left upper quadrant.  Sometimes has discomfort in RUQ as well.  Pain comes and goes.   Tends to get the pain just as starts eating or when he hasn't eaten for a while. Will sometimes eat small snacks to keep it from hurting before a meal. Pain worse with drinking coffee. Was drinking a fair amount of beer when this first started (80 oz of malt liquor daily)  And stopped because it seemed to make it worse. Has been eating a lot of chocolate, onions, and tomatoes.   Does smoke 1-2 cigarettes daily, but stopped 2 months ago because of the pain. No soda, but does drink a large Monster Drink every day. Denies taking any NSAIDs recently. Sometimes can radiate to the back a little bit.  Sometimes radiates into mid chest area.  Has had gastritis in the past, and the type of pain is the same, just worse now.   Does feel he is getting acid burning into his chest at times.   No change in color of stool, no melena, or hematochezia. Occasional constipation, but not more frequent.  No diarrhea.    No weight loss.  No fever.  Poor sleep for >10 years    Review of Systems     Objective:   Physical Exam HEENT:  PERRL, EOMI, throat without injection. Neck:  Supple, no adenopathy or thyromegaly Chest:  CTA CV:  RRR without murmur or rub, Radial pulses normal and equal. Abd:  S, NT, No HSM or massis, +BS      Assessment & Plan:   Epigastric pain:  LIkely PUD vs Gastritis. Long discussion regarding lifestyle, eating habits, sleep habits. Stop caffeine, including coffee and Monster drinks, Continue to avoid alcohol and cigarettes. Start Omeprazole 40 mg at bedtime on empty stomach. 3 meals daily heavy on veggies and fruits, water. Regular physical activity. Set bedtime and wake time. Eat with family as often as possible. Discussed  the possibility of counseling for stress and anxiety with Nilda SimmerNatosha Knight, whom he knows from her work in his neighborhood previously.  He would like to make lifestyle changes first before counseling.   Does  feel he drinks  to calm anxiety.

## 2015-09-22 ENCOUNTER — Ambulatory Visit (INDEPENDENT_AMBULATORY_CARE_PROVIDER_SITE_OTHER): Payer: Self-pay | Admitting: Internal Medicine

## 2015-09-22 ENCOUNTER — Encounter: Payer: Self-pay | Admitting: Internal Medicine

## 2015-09-22 VITALS — BP 120/80 | HR 74 | Ht 67.5 in | Wt 202.0 lb

## 2015-09-22 DIAGNOSIS — K029 Dental caries, unspecified: Secondary | ICD-10-CM

## 2015-09-22 DIAGNOSIS — K279 Peptic ulcer, site unspecified, unspecified as acute or chronic, without hemorrhage or perforation: Secondary | ICD-10-CM

## 2015-09-22 DIAGNOSIS — R5383 Other fatigue: Secondary | ICD-10-CM

## 2015-09-22 NOTE — Progress Notes (Signed)
   Subjective:    Patient ID: Frank BeamsUlises Wilcox, male    DOB: 06-Jan-1981, 34 y.o.   MRN: [id number]  HPI  Epigastric Pain vs. PUD:  Feeling much better, but every once and a while eats something that makes him feel bad.  Usually soon after eating.   Biscuit sandwich with eggs/cheese/sausage or pizza, or when he eats anything with flour.  Generally, has episodes twice weekly now--pain can last all day. Has stopped the monster drinks, no sodas, No tobacco, no alcohol.   Still feels tired. Goes to bed around 9-10 p.m. But awakens at 2 a.m. And cannot get back to sleep until 4 a.m.  Lies in bed getting frustrated until falls to sleep Gets up at 6-6:30 in a.m. To start his day. Does not feel well rested. Has been looking at his symptoms on line and is concerned he has something bad. Looking back at medical records in Epic for past 2 years, he has not had any weight loss. A painful molar also keeps him up at night.    Review of Systems     Objective:   Physical Exam  Appears anxious--pressured speech, jiggling leg HEENT:  Right upper molar with decay Lungs:  CTA CV: RRR without murmur or rub ABD:  S, NT, No HSM or masses, +BS       Assessment & Plan:  1.  Probable PUD vs. Gastritis:  Continue Omeprazole.  F/U in 2 months  2.  Anxiety:  Warm hand off to Humana Incatosha Knight with concerns for anxiety.  Pt. Will call if he decides to get counseling  3.  Fatigue:  Check TSH, CBC, CMP.  Normal FLP last year. Follow up in 2 months

## 2015-09-23 LAB — COMPREHENSIVE METABOLIC PANEL
A/G RATIO: 1.8 (ref 1.1–2.5)
ALT: 25 IU/L (ref 0–44)
AST: 24 IU/L (ref 0–40)
Albumin: 4.5 g/dL (ref 3.5–5.5)
Alkaline Phosphatase: 59 IU/L (ref 39–117)
BUN/Creatinine Ratio: 19 (ref 8–19)
BUN: 16 mg/dL (ref 6–20)
Bilirubin Total: 0.4 mg/dL (ref 0.0–1.2)
CO2: 28 mmol/L (ref 18–29)
CREATININE: 0.83 mg/dL (ref 0.76–1.27)
Calcium: 9.6 mg/dL (ref 8.7–10.2)
Chloride: 101 mmol/L (ref 97–106)
GFR calc Af Amer: 134 mL/min/{1.73_m2} (ref >59)
GFR, EST NON AFRICAN AMERICAN: 116 mL/min/{1.73_m2} (ref >59)
Globulin, Total: 2.5 g/dL (ref 1.5–4.5)
Glucose: 93 mg/dL (ref 65–99)
POTASSIUM: 4.7 mmol/L (ref 3.5–5.2)
Sodium: 142 mmol/L (ref 136–144)
Total Protein: 7 g/dL (ref 6.0–8.5)

## 2015-09-23 LAB — CBC WITH DIFFERENTIAL/PLATELET
Basophils Absolute: 0 10*3/uL (ref 0.0–0.2)
Basos: 0 %
EOS (ABSOLUTE): 0.1 10*3/uL (ref 0.0–0.4)
EOS: 1 %
HEMATOCRIT: 43.8 % (ref 37.5–51.0)
Hemoglobin: 14.6 g/dL (ref 12.6–17.7)
Immature Grans (Abs): 0 10*3/uL (ref 0.0–0.1)
Immature Granulocytes: 0 %
LYMPHS ABS: 2.7 10*3/uL (ref 0.7–3.1)
Lymphs: 32 %
MCH: 31.3 pg (ref 26.6–33.0)
MCHC: 33.3 g/dL (ref 31.5–35.7)
MCV: 94 fL (ref 79–97)
MONOS ABS: 0.6 10*3/uL (ref 0.1–0.9)
Monocytes: 7 %
NEUTROS ABS: 4.9 10*3/uL (ref 1.4–7.0)
Neutrophils: 60 %
Platelets: 208 10*3/uL (ref 150–379)
RBC: 4.67 x10E6/uL (ref 4.14–5.80)
RDW: 13.3 % (ref 12.3–15.4)
WBC: 8.4 10*3/uL (ref 3.4–10.8)

## 2015-09-23 LAB — TSH: TSH: 0.982 u[IU]/mL (ref 0.450–4.500)

## 2015-10-16 DIAGNOSIS — A048 Other specified bacterial intestinal infections: Secondary | ICD-10-CM | POA: Insufficient documentation

## 2015-10-16 HISTORY — DX: Other specified bacterial intestinal infections: A04.8

## 2015-11-24 ENCOUNTER — Encounter: Payer: Self-pay | Admitting: Internal Medicine

## 2015-11-24 ENCOUNTER — Ambulatory Visit (INDEPENDENT_AMBULATORY_CARE_PROVIDER_SITE_OTHER): Payer: Self-pay | Admitting: Internal Medicine

## 2015-11-24 VITALS — BP 124/80 | HR 78 | Ht 67.5 in | Wt 202.0 lb

## 2015-11-24 DIAGNOSIS — K279 Peptic ulcer, site unspecified, unspecified as acute or chronic, without hemorrhage or perforation: Secondary | ICD-10-CM

## 2015-11-24 DIAGNOSIS — Z23 Encounter for immunization: Secondary | ICD-10-CM

## 2015-11-24 DIAGNOSIS — F419 Anxiety disorder, unspecified: Secondary | ICD-10-CM

## 2015-11-24 DIAGNOSIS — K2970 Gastritis, unspecified, without bleeding: Secondary | ICD-10-CM

## 2015-11-24 MED ORDER — OMEPRAZOLE 40 MG PO CPDR: DELAYED_RELEASE_CAPSULE | ORAL | Status: DC

## 2015-11-24 NOTE — Patient Instructions (Signed)
Continue to avoid caffeine and chiles and anything else that seems to cause burning in your stomach

## 2015-11-24 NOTE — Progress Notes (Signed)
   Subjective:    Patient ID: Frank Wilcox, male    DOB: [birth year], 35 y.o.   MRN: [id number]  HPI   1.  Gastritis/PUD:  Has been taking Omeprazole 40 mg daily for about 3 months now.   Not smoking.  1 cup coffee daily, though recently stopped, no alcohol.  No Monster drinks.  No NSAIDS.   Only time he has epigastric discomfort is with chiles and coffee. Would be okay not eating chiles and drinking coffee.  2.  Anxiety/insomnia:  Has not met with Frank Clines, LCSW.   Generally with problems only when cannot find work.       Review of Systems     Objective:   Physical Exam   NAD Lungs:  CTA CV:  RRR without murmur or rub Abd:  S, NT, No HSM or masses, + BS throughout        Assessment & Plan:  1.  Gastritis:  Continue with Omeprazole for 6 months or more and then try to wean.   Address stress and anxiety with Frank Clines, LCSW.  He seems more likely to follow through with counseling today.  2.  Anxiety and Insomnia:  Met with Frank Clines, LCSW and scheduling appt for follow up.

## 2016-03-01 ENCOUNTER — Encounter: Payer: Self-pay | Admitting: Internal Medicine

## 2016-03-01 ENCOUNTER — Ambulatory Visit (INDEPENDENT_AMBULATORY_CARE_PROVIDER_SITE_OTHER): Payer: Self-pay | Admitting: Internal Medicine

## 2016-03-01 VITALS — BP 120/78 | HR 66 | Resp 17 | Ht 67.5 in | Wt 204.0 lb

## 2016-03-01 DIAGNOSIS — K2970 Gastritis, unspecified, without bleeding: Secondary | ICD-10-CM

## 2016-03-01 DIAGNOSIS — K219 Gastro-esophageal reflux disease without esophagitis: Secondary | ICD-10-CM

## 2016-03-01 MED ORDER — PANTOPRAZOLE SODIUM 40 MG PO TBEC: 40.0000 mg | DELAYED_RELEASE_TABLET | Freq: Every day | ORAL | Status: DC

## 2016-03-01 NOTE — Progress Notes (Signed)
   Subjective:    Patient ID: Frank Wilcox, male    DOB: 05-24-1981, 35 y.o.   MRN: [id number]  HPI   1.  GERD/Gastritis:  Started having problems again about 1 1/2 months ago.  States he never stopped taking Omeprazole.   Only has symptoms after eating.  Discomfort after every meal and lasts about 2 hours.  Has early satiety and feels like he has difficulty breathing because he is bloated.  The pain just after eating is burning in nature.  Taking Omeprazole 40 mg on empty stomach at bedtime.   No lying down after eating for 2 hours. No NSAID use.   Drinking coffee in the morning-1 cup three times weekly. No soda No smoking. No alcohol. Eating lots of foods with tomatoes and onions.  States he never made a change with removing these from diet when improved in past. HOB not elevated. No new stressors. Had one episode of scant red blood on toilet paper about 2-3 months ago.     Diet yesterday:  Grits and potatoes with scrambled eggs for breakfast with coffee [employer] bacon hamburger with water. Cereal for dinner.   Current outpatient prescriptions:  .  omeprazole (PRILOSEC) 40 MG capsule, 1 capsule by mouth daily at bedtime on an empty stomach., Disp: 30 capsule, Rfl: 4   No Known Allergies      Review of Systems     Objective:   Physical Exam NAD Lungs:  CTA CV:   RRR without murmur or rub, Radial pulses normal and equal Abd:  S, mild epigastric tenderness, No rebound or peritoneal signs,  No HSM or mass, +BS Rectal:  No mass, heme negative dark brown stool.  NT, good tone.       Assessment & Plan:  1.  Gastritis/GERD:  Suspect patient is overeating and also not eating in a healthy way.  Will switch PPI to Protonoix 40 mg, GI referral for EGD.   Needs to work on diet and physical activity, elevate HOB Follow up in 1 month CBC in December was normal.

## 2016-03-01 NOTE — Patient Instructions (Signed)
EAt smaller meals Limit fried and fatty meals  Tome un vaso de agua antes de cada comida Tome un minimo de 6 a 8 vasos de agua diarios Coma tres veces al dia Coma una proteina y Neomia Dearuna grasa saludable con comida.  (huevos, pescado, pollo, pavo, y limite carnes rojas Coma 5 porciones diarias de legumbres.  Mezcle los colores Coma 2 porciones diarias de frutas con cascara cuando sea comestible Use platos pequeos Suelte su tenedor o cuchara despues de cada mordida hata que se mastique y se trague Come en la mesa con amigos o familiares por lo menos una vez al dia Apague la televisin y aparatos electrnicos durante la comida  Su objetivo debe ser perder Neomia Dearuna libra por semana

## 2016-03-22 ENCOUNTER — Ambulatory Visit: Payer: Self-pay | Admitting: Internal Medicine

## 2016-04-05 ENCOUNTER — Encounter: Payer: Self-pay | Admitting: Internal Medicine

## 2016-04-05 ENCOUNTER — Ambulatory Visit (INDEPENDENT_AMBULATORY_CARE_PROVIDER_SITE_OTHER): Payer: Self-pay | Admitting: Internal Medicine

## 2016-04-05 VITALS — BP 110/58 | HR 54 | Resp 16 | Ht 67.5 in | Wt 203.0 lb

## 2016-04-05 DIAGNOSIS — R1013 Epigastric pain: Secondary | ICD-10-CM

## 2016-04-05 DIAGNOSIS — K219 Gastro-esophageal reflux disease without esophagitis: Secondary | ICD-10-CM

## 2016-04-05 DIAGNOSIS — K2970 Gastritis, unspecified, without bleeding: Secondary | ICD-10-CM

## 2016-04-05 MED ORDER — PANTOPRAZOLE SODIUM 40 MG PO TBEC: DELAYED_RELEASE_TABLET | ORAL | Status: DC

## 2016-04-05 NOTE — Progress Notes (Signed)
   Subjective:    Patient ID: Frank BeamsUlises Castillo-Guevara, male    DOB: 05/12/1981, 35 y.o.   MRN: [id number]  HPI   1.  Epigastric pain:  Still waking most mornings with burning in epigastrium.  Does not feel the acid coming up into chest. Epigastrium with burning after eating as well.  Sometimes feels pain in right epigastrium.  No radiation to back or shoulder. Occasionally with nausea, no vomiting. Does note when he lies on left side, he feels better.  Lying on back makes it worse. No diarrhea or constipation. Switched to Pantoprazole 40 mg at bedtime one month ago at last visit.   HOB is elevated as well. Denies alcohol, foods we have discussed in past, smoking, NSAIDS. Has lost 1 lb since last visit.   Is anxious--only due to his epigastric burning, however.   No melena or hematochezia.     Current outpatient prescriptions:  .  pantoprazole (PROTONIX) 40 MG tablet, Take 1 tablet (40 mg total) by mouth daily., Disp: 30 tablet, Rfl: 3   No Known Allergies      Review of Systems     Objective:   Physical Exam Lungs:  CTA CV:  RRR without murmur or rub, radial pulses normal and equal Abd:  S, inconsistent midepigastric tenderness, No HSM or mass, Negative Murphy's sign.          Assessment & Plan:  Continued burning epigastric pain:  Pt. Has reportedly improved diet and is performing GERD precautions.   Increase Pantoprazole to twice daily dosing. [facility]. Exam is essentially unremarkable.

## 2016-04-06 LAB — COMPREHENSIVE METABOLIC PANEL
A/G RATIO: 1.7 (ref 1.2–2.2)
ALBUMIN: 4.7 g/dL (ref 3.5–5.5)
ALT: 18 IU/L (ref 0–44)
AST: 19 IU/L (ref 0–40)
Alkaline Phosphatase: 63 IU/L (ref 39–117)
BUN / CREAT RATIO: 27 — AB (ref 9–20)
BUN: 22 mg/dL — ABNORMAL HIGH (ref 6–20)
Bilirubin Total: 0.3 mg/dL (ref 0.0–1.2)
CALCIUM: 9.7 mg/dL (ref 8.7–10.2)
CO2: 24 mmol/L (ref 18–29)
CREATININE: 0.81 mg/dL (ref 0.76–1.27)
Chloride: 100 mmol/L (ref 96–106)
GFR calc Af Amer: 135 mL/min/{1.73_m2} (ref >59)
GFR, EST NON AFRICAN AMERICAN: 117 mL/min/{1.73_m2} (ref >59)
GLOBULIN, TOTAL: 2.7 g/dL (ref 1.5–4.5)
Glucose: 82 mg/dL (ref 65–99)
Potassium: 4.6 mmol/L (ref 3.5–5.2)
SODIUM: 140 mmol/L (ref 134–144)
Total Protein: 7.4 g/dL (ref 6.0–8.5)

## 2016-04-06 LAB — CBC WITH DIFFERENTIAL/PLATELET
BASOS: 0 %
Basophils Absolute: 0 10*3/uL (ref 0.0–0.2)
EOS (ABSOLUTE): 0.1 10*3/uL (ref 0.0–0.4)
EOS: 2 %
HEMATOCRIT: 45.4 % (ref 37.5–51.0)
HEMOGLOBIN: 15.3 g/dL (ref 12.6–17.7)
IMMATURE GRANULOCYTES: 0 %
Immature Grans (Abs): 0 10*3/uL (ref 0.0–0.1)
LYMPHS ABS: 2.6 10*3/uL (ref 0.7–3.1)
Lymphs: 33 %
MCH: 31.8 pg (ref 26.6–33.0)
MCHC: 33.7 g/dL (ref 31.5–35.7)
MCV: 94 fL (ref 79–97)
MONOCYTES: 7 %
Monocytes Absolute: 0.6 10*3/uL (ref 0.1–0.9)
Neutrophils Absolute: 4.5 10*3/uL (ref 1.4–7.0)
Neutrophils: 58 %
Platelets: 203 10*3/uL (ref 150–379)
RBC: 4.81 x10E6/uL (ref 4.14–5.80)
RDW: 13.4 % (ref 12.3–15.4)
WBC: 7.9 10*3/uL (ref 3.4–10.8)

## 2016-04-06 LAB — LIPASE: LIPASE: 52 U/L (ref 0–59)

## 2016-04-19 ENCOUNTER — Telehealth: Payer: Self-pay | Admitting: Internal Medicine

## 2016-04-19 NOTE — Telephone Encounter (Signed)
Patient walked in today to get an update about his GI referral. [facility]. Patient will have to be first screened by financial counseling Patient states [facility] and would like to be referred here locally ASAP and agrees to pay fees. Patient states [facility] and would like have referral sent there.

## 2016-04-20 NOTE — Telephone Encounter (Signed)
Patient scheduled.

## 2016-05-06 ENCOUNTER — Emergency Department (HOSPITAL_COMMUNITY): Payer: Self-pay

## 2016-05-06 ENCOUNTER — Encounter (HOSPITAL_COMMUNITY): Payer: Self-pay

## 2016-05-06 ENCOUNTER — Emergency Department (HOSPITAL_COMMUNITY)
Admission: EM | Admit: 2016-05-06 | Discharge: 2016-05-06 | Disposition: A | Discharge status: Home / Self Care | Payer: Self-pay | Attending: Emergency Medicine | Admitting: Emergency Medicine

## 2016-05-06 DIAGNOSIS — R1011 Right upper quadrant pain: Secondary | ICD-10-CM

## 2016-05-06 DIAGNOSIS — Z87891 Personal history of nicotine dependence: Secondary | ICD-10-CM | POA: Insufficient documentation

## 2016-05-06 DIAGNOSIS — K2100 Gastro-esophageal reflux disease with esophagitis, without bleeding: Secondary | ICD-10-CM

## 2016-05-06 DIAGNOSIS — K2970 Gastritis, unspecified, without bleeding: Secondary | ICD-10-CM | POA: Insufficient documentation

## 2016-05-06 LAB — URINALYSIS, ROUTINE W REFLEX MICROSCOPIC
BILIRUBIN URINE: NEGATIVE
Glucose, UA: NEGATIVE mg/dL
Hgb urine dipstick: NEGATIVE
Ketones, ur: NEGATIVE mg/dL
Leukocytes, UA: NEGATIVE
NITRITE: NEGATIVE
Protein, ur: NEGATIVE mg/dL
SPECIFIC GRAVITY, URINE: 1.023 (ref 1.005–1.030)
pH: 6.5 (ref 5.0–8.0)

## 2016-05-06 LAB — CBC
HCT: 44.9 % (ref 39.0–52.0)
HEMOGLOBIN: 15 g/dL (ref 13.0–17.0)
MCH: 31.2 pg (ref 26.0–34.0)
MCHC: 33.4 g/dL (ref 30.0–36.0)
MCV: 93.3 fL (ref 78.0–100.0)
Platelets: 177 10*3/uL (ref 150–400)
RBC: 4.81 MIL/uL (ref 4.22–5.81)
RDW: 12.5 % (ref 11.5–15.5)
WBC: 4.9 10*3/uL (ref 4.0–10.5)

## 2016-05-06 LAB — COMPREHENSIVE METABOLIC PANEL
ALK PHOS: 52 U/L (ref 38–126)
ALT: 23 U/L (ref 17–63)
ANION GAP: 6 (ref 5–15)
AST: 22 U/L (ref 15–41)
Albumin: 4.1 g/dL (ref 3.5–5.0)
BILIRUBIN TOTAL: 0.8 mg/dL (ref 0.3–1.2)
BUN: 17 mg/dL (ref 6–20)
CALCIUM: 9 mg/dL (ref 8.9–10.3)
CO2: 24 mmol/L (ref 22–32)
Chloride: 107 mmol/L (ref 101–111)
Creatinine, Ser: 0.76 mg/dL (ref 0.61–1.24)
GFR calc non Af Amer: >60 mL/min (ref >60)
Glucose, Bld: 106 mg/dL — ABNORMAL HIGH (ref 65–99)
Potassium: 3.8 mmol/L (ref 3.5–5.1)
Sodium: 137 mmol/L (ref 135–145)
TOTAL PROTEIN: 6.9 g/dL (ref 6.5–8.1)

## 2016-05-06 LAB — LIPASE, BLOOD: Lipase: 36 U/L (ref 11–51)

## 2016-05-06 MED ORDER — GI COCKTAIL ~~LOC~~
30.0000 mL | Freq: Once | ORAL | Status: AC
Start: 2016-05-06 — End: 2016-05-06
  Administered 2016-05-06: 30 mL via ORAL
  Filled 2016-05-06: qty 30

## 2016-05-06 MED ORDER — RANITIDINE HCL 150 MG PO CAPS: 150.0000 mg | ORAL_CAPSULE | Freq: Two times a day (BID) | ORAL | 0 refills | Status: DC

## 2016-05-06 MED ORDER — SUCRALFATE 1 G PO TABS: 1.0000 g | ORAL_TABLET | Freq: Three times a day (TID) | ORAL | 0 refills | Status: DC

## 2016-05-06 NOTE — ED Notes (Signed)
Spoke to [place] regarding pt test.  They will be sending for him shortly.

## 2016-05-06 NOTE — ED Triage Notes (Signed)
Patient here with epigastric pain that he describes as burning for several days, states that the pain is worse when he eats. Nausea, no vomiting, no diarrhea

## 2016-05-06 NOTE — ED Provider Notes (Signed)
[facility] DEPT Provider Note   CSN: [id number] Arrival date & time: 05/06/16  0800  First Provider Contact: 05/06/16 at 09:15 AM       History   Chief Complaint Chief Complaint  Patient presents with  . Abdominal Pain    HPI Frank Wilcox is a 35 y.o. male.  35 year old male with past medical history of recurrent gastritis, currently being worked up by his PCP and GI, who presents with worsening epigastric abdominal pain. The patient states that over the last several days, he has developed persistently worsening epigastric pain. Describes it as a burning, aching sensation. The pain similar to his usual pain, but is not improving with his pantoprazole prescribed by his primary care doctor. The pain seems to worsen with eating and then gradually improves. Pain is made worse with spicy foods. Pain is made worse with lying flat as well. Of note, he does report intermittent aching right upper quadrant pain that radiates to his right shoulder. Denies any chest pain or shortness of breath. Denies any cardiac history. No fevers or chills. No vomiting.   The history is provided by the patient and medical records. The history is limited by a language barrier. A language interpreter was used.    Past Medical History:  Diagnosis Date  . Alcohol abuse   . Anxiety   . Esophageal reflux   . S/P laparoscopic appendectomy 06/25/2012  . Sleep disturbance, unspecified   . Unspecified gastritis and gastroduodenitis without mention of hemorrhage     Patient Active Problem List   Diagnosis Date Noted  . Gastritis 03/01/2016  . GERD (gastroesophageal reflux disease) 05/21/2014  . Abdominal pain when drinking alcohol 04/15/2014  . Hip pain, bilateral 09/28/2013  . Alcoholism (HCC) 06/22/2013  . Orthostasis 04/03/2013  . Back pain 01/14/2013  . Lateral epicondylitis  of elbow 07/24/2012  . Healthcare maintenance 07/24/2012  . Obesity (BMI 30-39.9) 07/24/2012  . Dyspepsia  01/09/2012    Past Surgical History:  Procedure Laterality Date  . ANKLE SURGERY Right 2007   ORIF right ankle fractures--work related injury  . APPENDECTOMY    . LAPAROSCOPIC APPENDECTOMY  06/06/2012   Procedure: APPENDECTOMY LAPAROSCOPIC;  Surgeon: Liz Malady, MD;  Location: [facility] OR;  Service: General;  Laterality: N/A;       Home Medications    Prior to Admission medications   Medication Sig Start Date End Date Taking? Authorizing Provider  pantoprazole (PROTONIX) 40 MG tablet 1 tab by mouth twice daily on empty stomach 04/05/16  Yes Julieanne Manson, MD  ranitidine (ZANTAC) 150 MG capsule Take 1 capsule (150 mg total) by mouth 2 (two) times daily. 05/06/16   Shaune Pollack, MD  sucralfate (CARAFATE) 1 g tablet Take 1 tablet (1 g total) by mouth 4 (four) times daily -  with meals and at bedtime. 05/06/16   Shaune Pollack, MD    Family History Family History  Problem Relation Age of Onset  . Diabetes Mother   . Anxiety disorder Mother   . Hyperlipidemia Father   . Hypertension Father   . Gout Father     Social History Social History  Substance Use Topics  . Smoking status: Former Smoker    Packs/day: 0.10    Types: Cigarettes    Start date: 06/10/1996    Quit date: 06/23/2015  . Smokeless tobacco: Never Used  . Alcohol use 0.0 oz/week     Comment: was drinking 80 oz of malt liquor until stomach pain caused him to stop 06/2015  Allergies   Review of patient's allergies indicates no known allergies.   Review of Systems Review of Systems  Constitutional: Negative for chills, fatigue and fever.  HENT: Negative for congestion and rhinorrhea.   Eyes: Negative for visual disturbance.  Respiratory: Negative for cough, shortness of breath and wheezing.   Cardiovascular: Negative for chest pain and leg swelling.  Gastrointestinal: Positive for abdominal pain. Negative for diarrhea, nausea and vomiting.  Genitourinary: Negative for dysuria and flank pain.    Musculoskeletal: Negative for neck pain and neck stiffness.  Skin: Negative for rash.  Allergic/Immunologic: Negative for immunocompromised state.  Neurological: Negative for syncope and headaches.     Physical Exam Updated Vital Signs BP 111/58 (BP Location: Right Arm)   Pulse (!) 58   Temp 98.4 F (36.9 C) (Oral)   Resp 18   Ht  (1.727 m)   Wt 200 lb (90.7 kg)   SpO2 100%   BMI 30.41 kg/m   Physical Exam  Constitutional: He appears well-developed and well-nourished. No distress.  HENT:  Head: Normocephalic.  Mouth/Throat: Oropharynx is clear and moist. No oropharyngeal exudate.  Eyes: Conjunctivae are normal. Pupils are equal, round, and reactive to light.  Neck: Normal range of motion. Neck supple.  Cardiovascular: Normal rate, regular rhythm and normal heart sounds.  Exam reveals no friction rub.   No murmur heard. Pulmonary/Chest: Effort normal and breath sounds normal. No respiratory distress. He has no wheezes. He has no rales.  Abdominal: Soft. Bowel sounds are normal. He exhibits no distension and no mass. There is tenderness (Mild epigastric and right upper quadrant tenderness with no Murphy sign.). There is no guarding.  Musculoskeletal: He exhibits no edema.  Neurological: He is alert. He exhibits normal muscle tone.  Skin: Skin is warm.  Nursing note and vitals reviewed.    ED Treatments / Results  Labs (all labs ordered are listed, but only abnormal results are displayed) Labs Reviewed  COMPREHENSIVE METABOLIC PANEL - Abnormal; Notable for the following:       Result Value   Glucose, Bld 106 (*)    All other components within normal limits  LIPASE, BLOOD  CBC  URINALYSIS, ROUTINE W REFLEX MICROSCOPIC (NOT AT [facility])    EKG  EKG Interpretation  Date/Time:  Sunday May 06 2016 09:44:21 EDT Ventricular Rate:  55 PR Interval:    QRS Duration: 83 QT Interval:  389 QTC Calculation: 372 R Axis:   68 Text Interpretation:  Sinus rhythm Low  voltage, precordial leads ST elev, probable normal early repol pattern No significant change since last tracing Confirmed by Annita Ratliff MD, Makyla Bye [phone]) on 05/06/2016 9:55:12 AM       Radiology US Abdomen Limited Ruq  Result Date: 05/06/2016 CLINICAL DATA:  Right upper quadrant pain for 3 weeks. EXAM: US ABDOMEN LIMITED - RIGHT UPPER QUADRANT COMPARISON:  None. FINDINGS: Gallbladder: No gallstones seen. There is no gallbladder wall thickening, pericholecystic fluid or other secondary sonographic signs of acute cholecystitis. No sonographic Murphy's sign elicited during the examination, per the sonographer. Common bile duct: Diameter: Normal at 2 mm Liver: No focal lesion identified. Within normal limits in parenchymal echogenicity. IMPRESSION: Normal right upper quadrant ultrasound. Electronically Signed   By: Bary Richard M.D.   On: 05/06/2016 13:52   Procedures Procedures (including critical care time)  Medications Ordered in ED Medications  gi cocktail (Maalox,Lidocaine,Donnatal) (30 mLs Oral Given 05/06/16 0947)     Initial Impression / Assessment and Plan / ED Course  I  have reviewed the triage vital signs and the nursing notes.  Pertinent labs & imaging results that were available during my care of the patient were reviewed by me and considered in my medical decision making (see chart for details).  Clinical Course  Value Comment By Time  US Abdomen Limited RUQ (Reviewed) Shaune Pollack, MD 07/23 [phone]  Glucose: (!) 106 (Reviewed) Audry Pili, PA-C 07/24 5038    35 year old male with past history of recurrent gastritis/GERD who presents with worsening epigastric burning pain. See history of present illness above. On arrival, vital signs are stable within normal limits. Examination is as above. Patient is overall well-appearing, in no acute distress. Records from previous PCP visits reviewed in detail. Patient has been diagnosed with recurrent gastritis and is on a PPI. Negative H.  Pylori per report. He is currently referred to a primary GI for EGD and this is scheduled in several weeks. Suspect patient has ongoing gastritis secondary to alcohol and dietary indiscretion. His abdomen is soft without peritonitis. No fever, anorexia, RLQ TTP, or signs of appendicitis. Also on differential is possible biliary colic as he does intermittently have right upper quadrant pain, so we'll obtain ultrasound. Otherwise, labs are reassuring. CMP shows normal LFTs and bilirubin, making significant, or disease of likely. Lipase is normal. Patient is otherwise well-appearing. Screening EKG is unremarkable with benign early repo, but no acute ST segment changes. I do not suspect cardiac etiology.  Right upper quadrant ultrasound is negative. Symptoms resolved with GI cocktail. VSS (of note, reported low BP were with cuff on forearm and elevated on side; likely spurious). I discussed findings and diagnosis with the patient. Will add on a H2 blocker, Carafate, and discharge. I provided him with GI information if he would like to schedule follow-up with another clinic but do not feel emergent EGD is indicated at this time. Patient is in agreement with this plan. Return precautions given.  Final Clinical Impressions(s) / ED Diagnoses   Final diagnoses:  Abdominal pain, RUQ  Gastritis  Gastroesophageal reflux disease with esophagitis    New Prescriptions Discharge Medication List as of 05/06/2016  2:52 PM    START taking these medications   Details  ranitidine (ZANTAC) 150 MG capsule Take 1 capsule (150 mg total) by mouth 2 (two) times daily., Starting Sun 05/06/2016, Print    sucralfate (CARAFATE) 1 g tablet Take 1 tablet (1 g total) by mouth 4 (four) times daily -  with meals and at bedtime., Starting Sun 05/06/2016, Print         Shaune Pollack, MD 05/06/16 2106

## 2016-06-04 ENCOUNTER — Encounter: Payer: Self-pay | Admitting: *Deleted

## 2016-06-22 ENCOUNTER — Ambulatory Visit (INDEPENDENT_AMBULATORY_CARE_PROVIDER_SITE_OTHER): Payer: Self-pay | Admitting: Internal Medicine

## 2016-06-22 ENCOUNTER — Encounter (INDEPENDENT_AMBULATORY_CARE_PROVIDER_SITE_OTHER): Payer: Self-pay

## 2016-06-22 ENCOUNTER — Encounter: Payer: Self-pay | Admitting: Internal Medicine

## 2016-06-22 VITALS — BP 94/60 | HR 72 | Ht 68.0 in | Wt 203.4 lb

## 2016-06-22 DIAGNOSIS — R1013 Epigastric pain: Secondary | ICD-10-CM

## 2016-06-22 DIAGNOSIS — Z8719 Personal history of other diseases of the digestive system: Secondary | ICD-10-CM

## 2016-06-22 MED ORDER — RANITIDINE HCL 150 MG PO TABS: 150.0000 mg | ORAL_TABLET | Freq: Two times a day (BID) | ORAL | 3 refills | Status: DC

## 2016-06-22 MED ORDER — SUCRALFATE 1 G PO TABS: 1.0000 g | ORAL_TABLET | Freq: Three times a day (TID) | ORAL | 1 refills | Status: DC

## 2016-06-22 NOTE — Patient Instructions (Signed)
You have been scheduled for an endoscopy. Please follow written instructions given to you at your visit today. If you use inhalers (even only as needed), please bring them with you on the day of your procedure. Your physician has requested that you go to www.startemmi.com and enter the access code given to you at your visit today. This web site gives a general overview about your procedure. However, you should still follow specific instructions given to you by our office regarding your preparation for the procedure.  We have sent the following medications to your pharmacy for you to pick up at your convenience: Ranitidine 150 mg twice daily carafate before meals and at bedtime.  Please follow a low acid diet.  If you are age 265 or older, your body mass index should be between 23-30. Your Body mass index is 30.92 kg/m. If this is out of the aforementioned range listed, please consider follow up with your Primary Care Provider.  If you are age 34 or younger, your body mass index should be between 19-25. Your Body mass index is 30.92 kg/m. If this is out of the aformentioned range listed, please consider follow up with your Primary Care Provider.

## 2016-06-22 NOTE — Progress Notes (Signed)
   Subjective:    Patient ID: Frank Wilcox, male    DOB: [birth year], 35 y.o.   MRN: [id number]  HPI Frank Wilcox is a 35 year old male with a remote history of H. pylori status post treatment and gastritis who is seen in follow-up at the request of Dr. Delrae AlfredMulberry to evaluate epigastric abdominal pain. He is here today with a medical Spanish interpreter. He is known to me from 2013 when he presented with similar upper GI complaints. This led to an upper endoscopy which was performed on 01/31/2012.This showed mild gastritis biopsied negative for H. pylori. He reports thereafter symptoms seemed to improve but over the last several months he's had more trouble with epigastric abdominal pain which he reports as burning and gnawing. He has stopped drinking beer and spicy foods as well as coffee. This has helped but the burning pain continues to some degree. He tried pantoprazole 40 mg twice daily which didn't seem to help and then ranitidine 150 twice a day and Carafate for meals and at bedtime. The latter 2 medications did seem to help somewhat. He reports regular bowel movements without melena or rectal bleeding. When he uses Maalox his stools become slightly loose. He is worried about stomach cancer. He denies a family history of stomach cancer  Review of Systems As per history of present illness, otherwise negative  Current Medications, Allergies, Past Medical History, Past Surgical History, Family History and Social History were reviewed in [facility] electronic medical record.     Objective:   Physical Exam BP 94/60 (BP Location: Left Arm, Patient Position: Sitting, Cuff Size: Normal)   Pulse 72   Ht 5\' 8"  (1.727 m) Comment: height measured without shoes  Wt 203 lb 6 oz (92.3 kg)   BMI 30.92 kg/m  Constitutional: Well-developed and well-nourished. No distress. HEENT: Normocephalic and atraumatic. Oropharynx is clear and moist. No oropharyngeal exudate. Conjunctivae are  normal.  No scleral icterus. Neck: Neck supple. Trachea midline. Cardiovascular: Normal rate, regular rhythm and intact distal pulses. No M/R/G Pulmonary/chest: Effort normal and breath sounds normal. No wheezing, rales or rhonchi. Abdominal: Soft, Epigastric tenderness without rebound or guarding, nondistended. Bowel sounds active throughout. There are no masses palpable. No hepatosplenomegaly. Extremities: no clubbing, cyanosis, or edema Lymphadenopathy: No cervical adenopathy noted. Neurological: Alert and oriented to person place and time. Skin: Skin is warm and dry. No rashes noted. Psychiatric: Normal mood and affect. Behavior is normal.      Assessment & Plan:  35 year old male with a remote history of H. pylori status post treatment and gastritis who is seen in follow-up at the request of Dr. Delrae AlfredMulberry to evaluate epigastric abdominal pain.  1. Epigastric pain/hx of gastritis -- He is requesting repeat EGD. We discussed further therapy and H. pylori breath test but he prefers endoscopy. We discussed the risk of benefits and alternatives and he wishes to proceed. We will plan gastric biopsies to exclude H. pylori during this examination. Resume ranitidine 150 mg twice a day, Carafate 3 times a day before meals and at bedtime and a bland diet avoiding acidic and spicy foods.  25 minutes spent with the patient today. Greater than 50% was spent in counseling and coordination of care with the patient

## 2016-07-05 ENCOUNTER — Encounter: Payer: Self-pay | Admitting: Internal Medicine

## 2016-07-05 ENCOUNTER — Ambulatory Visit (AMBULATORY_SURGERY_CENTER): Payer: Self-pay | Admitting: Internal Medicine

## 2016-07-05 VITALS — BP 99/60 | HR 54 | Temp 97.7°F | Resp 12 | Ht 68.0 in | Wt 203.0 lb

## 2016-07-05 DIAGNOSIS — K2970 Gastritis, unspecified, without bleeding: Secondary | ICD-10-CM

## 2016-07-05 DIAGNOSIS — K2990 Gastroduodenitis, unspecified, without bleeding: Secondary | ICD-10-CM

## 2016-07-05 DIAGNOSIS — R1013 Epigastric pain: Secondary | ICD-10-CM

## 2016-07-05 DIAGNOSIS — B9681 Helicobacter pylori [H. pylori] as the cause of diseases classified elsewhere: Secondary | ICD-10-CM

## 2016-07-05 MED ORDER — SODIUM CHLORIDE 0.9 % IV SOLN: 500.0000 mL | INTRAVENOUS | Status: DC

## 2016-07-05 NOTE — Progress Notes (Signed)
Called to room to assist during endoscopic procedure.  Patient ID and intended procedure confirmed with present staff. Received instructions for my participation in the procedure from the performing physician.  

## 2016-07-05 NOTE — Patient Instructions (Addendum)
Esofagogastroduodenoscopia: cuidados posteriores (Esophagogastroduodenoscopy, Care After) Siga estas instrucciones durante las prximas semanas. Estas indicaciones le proporcionan informacin acerca de cmo deber cuidarse despus del procedimiento. El mdico tambin podr darle instrucciones ms especficas. El tratamiento ha sido planificado segn las prcticas mdicas actuales, pero en algunos casos pueden ocurrir problemas. Comunquese con el mdico si tiene algn problema o tiene dudas despus del procedimiento.  QU ESPERAR DESPUS DEL PROCEDIMIENTO  Despus del procedimiento, es tpico tener las siguientes sensaciones:   Dance movement psychotherapistDolor en la garganta.  Dolor al tragar.  Ganas de vomitar (nuseas).  Hinchazn.  Mareos.  Cansancio. INSTRUCCIONES PARA EL CUIDADO EN EL HOGAR  No coma ni beba nada hasta que el efecto del anestsico (anestesia local) haya desaparecido y vuelva a tener reflejo farngeo. Si puede tragar con comodidad, significa que el efecto de la anestesia local ha desaparecido.  No conduzca ni opere maquinarias hasta que el mdico lo autorice.  Tome los medicamentos solamente como se lo haya indicado el mdico. SOLICITE ATENCIN MDICA SI:   No puede dejar de toser.  No orina u orina menos de lo habitual. SOLICITE ATENCIN MDICA DE INMEDIATO SI:  Tiene dificultad para tragar.  No puede comer o beber.  El dolor de garganta o pecho empeora.  Est mareado, tiene una sensacin de desvanecimiento o se desmaya.  Tiene nuseas o vmitos.  Tiene escalofros.  Tiene fiebre.  Siente un dolor abdominal intenso.  La materia fecal es negra, de aspecto alquitranado o sanguinolenta.   Esta informacin no tiene Theme park managercomo fin reemplazar el consejo del mdico. Asegrese de hacerle al mdico cualquier pregunta que tenga.   Document Released: 01/26/2013 Document Revised: 10/22/2014 Elsevier Interactive Patient Education 2016 [employer]   Gastritis - Adultos  (Gastritis,  Adult)  Gastrittis es la hinchazn e irritacin (inflamacin) del revestimiento interno del estmago. Si no recibe tratamiento, la gastritis puede causar sangrado y llagas.(lceras) en el estmago. CUIDADOS EN EL HOGAR   Slo tome los medicamentos segn le indique el mdico.  Si le han recetado antibiticos, tmelos segn las indicaciones. Termine de Leggett & Platttomar el medicamento, aunque comience a sentirse mejor.  Beba gran cantidad de lquido para mantener el pis (orina) de tono claro o amarillo plido.  Evite las comidas y bebidas que United Stationersempeoran los problemas. Los alimentos que debe evitar son:  Geri SeminoleCafena y alcohol.  Chocolate.  Menta.  Ajo y cebolla.  Comidas muy condimentadas.  Ctricos como naranjas, limones o limas.  Alimentos que contengan tomate, como salsas, [place] y pizza.  Alimentos fritos y Lexicographergrasos.  Haga comidas pequeas durante Glass blower/designerel da en lugar de 3 comidas abundantes. SOLICITE AYUDA DE INMEDIATO SI:   La materia fecal (heces)es negra o de color rojo oscuro.  Vomita sangre. Puede ser similar a la borra del caf  No puede retener los lquidos.  El dolor en el vientre (abdominal) empeora.  Tiene fiebre.  No mejora luego de 1 semana.  Tiene preguntas o preocupaciones. ASEGRESE DE QUE:   Comprende estas instrucciones.  Controlar su enfermedad.  Solicitar ayuda de inmediato si no mejora o si empeora.   Esta informacin no tiene Theme park managercomo fin reemplazar el consejo del mdico. Asegrese de hacerle al mdico cualquier pregunta que tenga.   Document Released: 04/01/2012 Elsevier Interactive Patient Education [employer]2016 Elsevier Inc.

## 2016-07-05 NOTE — Progress Notes (Signed)
Report to PACU, RN, vss, BBS= Clear.  

## 2016-07-05 NOTE — Op Note (Signed)
[facility] Patient Name: Frank Wilcox Procedure Date: 07/05/2016 10:22 AM MRN: [id number] Endoscopist: Beverley FiedlerJay M Holy Battenfield , MD Age: [age] Referring MD:  Date of Birth: Nov 14, 1980 Gender: Male Account #: [id number] Procedure:                Upper GI endoscopy Indications:              Epigastric abdominal pain, remote history of H.                            Pylori Medicines:                Monitored Anesthesia Care Procedure:                Pre-Anesthesia Assessment:                           - Prior to the procedure, a History and Physical                            was performed, and patient medications and                            allergies were reviewed. The patient's tolerance of                            previous anesthesia was also reviewed. The risks                            and benefits of the procedure and the sedation                            options and risks were discussed with the patient.                            All questions were answered, and informed consent                            was obtained. Prior Anticoagulants: The patient has                            taken no previous anticoagulant or antiplatelet                            agents. ASA Grade Assessment: II - A patient with                            mild systemic disease. After reviewing the risks                            and benefits, the patient was deemed in                            satisfactory condition to undergo the procedure.  After obtaining informed consent, the endoscope was                            passed under direct vision. Throughout the                            procedure, the patient's blood pressure, pulse, and                            oxygen saturations were monitored continuously. The                            Model GIF-HQ190 [id number]) scope was introduced                            through the mouth, and advanced to the  second part                            of duodenum. The upper GI endoscopy was                            accomplished without difficulty. The patient                            tolerated the procedure well. Scope In: Scope Out: Findings:                 The examined esophagus was normal.                           Patchy mild inflammation characterized by erosions                            and erythema was found in the gastric fundus and in                            the gastric antrum. Biopsies were taken from the                            gastric body, antrum and incisura with a cold                            forceps for histology and Helicobacter pylori                            testing.                           Localized mild inflammation characterized by                            erosions and erythema was found in the duodenal                            bulb.  The second portion of the duodenum was normal. Complications:            No immediate complications. Estimated Blood Loss:     Estimated blood loss was minimal. Impression:               - Normal esophagus.                           - Gastritis. Biopsied.                           - Duodenitis.                           - Normal second portion of the duodenum. Recommendation:           - Patient has a contact number available for                            emergencies. The signs and symptoms of potential                            delayed complications were discussed with the                            patient. Return to normal activities tomorrow.                            Written discharge instructions were provided to the                            patient.                           - Resume previous diet. Avoid spicy and acidic                            foods.                           - Continue present medications, including                            ranitidine 150 mg twice daily.                            - Avoid aspirin, ibuprofen, naproxen, or other                            non-steroidal anti-inflammatory drugs.                           - Await pathology results. Beverley Fiedler, MD 07/05/2016 10:48:24 AM This report has been signed electronically.

## 2016-07-12 ENCOUNTER — Other Ambulatory Visit: Payer: Self-pay

## 2016-07-12 MED ORDER — BIS SUBCIT-METRONID-TETRACYC 140-125-125 MG PO CAPS: 3.0000 | ORAL_CAPSULE | Freq: Three times a day (TID) | ORAL | 0 refills | Status: DC

## 2016-07-12 MED ORDER — OMEPRAZOLE 20 MG PO CPDR: 20.0000 mg | DELAYED_RELEASE_CAPSULE | Freq: Two times a day (BID) | ORAL | 0 refills | Status: DC

## 2016-07-17 ENCOUNTER — Other Ambulatory Visit: Payer: Self-pay | Admitting: Internal Medicine

## 2016-07-17 MED ORDER — PANTOPRAZOLE SODIUM 40 MG PO TBEC
40.0000 mg | DELAYED_RELEASE_TABLET | Freq: Two times a day (BID) | ORAL | 2 refills | Status: DC
Start: 2016-07-17 — End: 2017-01-02

## 2016-12-27 ENCOUNTER — Other Ambulatory Visit: Payer: Self-pay | Admitting: Internal Medicine

## 2016-12-27 ENCOUNTER — Other Ambulatory Visit: Payer: Self-pay

## 2016-12-27 ENCOUNTER — Telehealth: Payer: Self-pay | Admitting: Internal Medicine

## 2016-12-27 DIAGNOSIS — A048 Other specified bacterial intestinal infections: Secondary | ICD-10-CM

## 2016-12-27 DIAGNOSIS — R1013 Epigastric pain: Secondary | ICD-10-CM

## 2016-12-27 NOTE — Telephone Encounter (Signed)
Please check stool Ag off PPI before retreatment

## 2016-12-27 NOTE — Telephone Encounter (Signed)
Patient notified of the recommendations.  He will come by and repeat labs

## 2016-12-27 NOTE — Telephone Encounter (Signed)
Patient reports 3 week history of epigastric and RUQ pain.  Same symptoms he had prior to being treated for H. Pylori.  Please advise

## 2016-12-28 LAB — HELICOBACTER PYLORI  SPECIAL ANTIGEN: H. PYLORI Antigen: NOT DETECTED

## 2017-01-02 ENCOUNTER — Encounter: Payer: Self-pay | Admitting: Physician Assistant

## 2017-01-02 ENCOUNTER — Ambulatory Visit (INDEPENDENT_AMBULATORY_CARE_PROVIDER_SITE_OTHER): Payer: Self-pay | Admitting: Physician Assistant

## 2017-01-02 VITALS — BP 100/60 | HR 79 | Ht 68.0 in | Wt 201.4 lb

## 2017-01-02 DIAGNOSIS — R140 Abdominal distension (gaseous): Secondary | ICD-10-CM

## 2017-01-02 DIAGNOSIS — R1013 Epigastric pain: Secondary | ICD-10-CM

## 2017-01-02 DIAGNOSIS — R110 Nausea: Secondary | ICD-10-CM

## 2017-01-02 MED ORDER — OMEPRAZOLE 20 MG PO CPDR: 20.0000 mg | DELAYED_RELEASE_CAPSULE | Freq: Two times a day (BID) | ORAL | 1 refills | Status: DC

## 2017-01-02 MED FILL — OMEPRAZOLE 20 MG CAPSULE DR: 20 | 30 days supply | Qty: 60 | Fill #0

## 2017-01-02 NOTE — Progress Notes (Addendum)
Chief Complaint: Epigastric Pain  HPI:  Frank Wilcox is a 36 year old Hispanic male, interpreter is present at the time of appointment, he returns to clinic today for a continued complaint of epigastric pain.   Please recall patient has followed with Dr. Rhea Belton last in September for the same pain. He was seen in clinic on 06/22/16 and described a remote history of H. pylori status post treatment and gastritis who continued with abdominal pain. He described epigastric abdominal pain over the past couple of months with burning and "gnawing". He stopped drinking beer and spicy foods as well as coffee which helped the burning. He tried pantoprazole 40 mg twice a day which didn't seem to help and then ranitidine 150 mg twice a day and Carafate for meals and at bedtime the latter 2 medications did help. He requested an EGD at that time and one was scheduled. EGD completed 07/05/16 showed gastritis and duodenitis and was otherwise normal. Biopsy showed continual H pylori. Patient was put on Pylera as well as a twice a day PPI. H. pylori fecal antigen on 12/25/16 was checked and negative. It was recommend he be restarted on a twice a day PPI for epigastric pain.   Today, the patient presents to clinic and explains that he continues with epigastric pain, it is somewhat better since being treated for his H. Pylori, but much the same. He describes a "burning sensation" in his upper abdomen which seems to be worse when he wakes up in the morning and some better after he eats breakfast. He tells me sometimes it can last all day but sometimes it goes away after eating. He also describes a "swelling" in his stomach after eating at times as well as occasional nausea. Currently he is not on any medications for this. He has not been on any over the past few months. He denies dysphagia or change in his bowel habits.   Patient denies fever, chills, change in bowel habits, weight loss, fatigue, anorexia, vomiting,  dysphagia or symptoms that awaken him at night.  Past Medical History:  Diagnosis Date  . Alcohol abuse   . Anxiety   . Esophageal reflux   . S/P laparoscopic appendectomy 06/25/2012  . Sleep disturbance, unspecified   . Unspecified gastritis and gastroduodenitis without mention of hemorrhage     Past Surgical History:  Procedure Laterality Date  . ANKLE SURGERY Right 2007   ORIF right ankle fractures--work related injury  . LAPAROSCOPIC APPENDECTOMY  06/06/2012   Procedure: APPENDECTOMY LAPAROSCOPIC;  Surgeon: Liz Malady, MD;  Location: [facility] OR;  Service: General;  Laterality: N/A;    Current Outpatient Prescriptions  Medication Sig Dispense Refill  . omeprazole (PRILOSEC) 20 MG capsule Take 1 capsule (20 mg total) by mouth 2 (two) times daily before a meal. 60 capsule 1   Current Facility-Administered Medications  Medication Dose Route Frequency Provider Last Rate Last Dose  . 0.9 %  sodium chloride infusion  500 mL Intravenous Continuous Beverley Fiedler, MD        Allergies as of 01/02/2017  . (No Known Allergies)    Family History  Problem Relation Age of Onset  . Diabetes Mother   . Anxiety disorder Mother   . Hyperlipidemia Father   . Hypertension Father   . Gout Father     Social History   Social History  . Marital status: Married    Spouse name: Myrlene Broker  . Number of children: 1  . Years of education: N/A  Occupational History  . Holiday representative Other   Social History Main Topics  . Smoking status: Former Smoker    Packs/day: 0.10    Types: Cigarettes    Start date: 06/10/1996    Quit date: 06/23/2015  . Smokeless tobacco: Never Used  . Alcohol use 0.0 oz/week     Comment: was drinking 80 oz of malt liquor until stomach pain caused him to stop 06/2015  . Drug use: No  . Sexual activity: Yes    Partners: Female     Comment: occ cigarette-not daily   Other Topics Concern  . Not on file   Social History Narrative   Originally from [place]   Moved  to [place]. In 2004.   Lives at home with wife and 2 children   Understands English, but much more comfortable with Spanish    Review of Systems:    Constitutional: No weight loss, fever or chills Cardiovascular: No chest pain Respiratory: No SOB Gastrointestinal: See HPI and otherwise negative   Physical Exam:  Vital signs: BP 100/60   Pulse 79   Ht 5\' 8"  (1.727 m)   Wt 201 lb 6.4 oz (91.4 kg)   SpO2 98%   BMI 30.62 kg/m   Constitutional:   Pleasant Hispanic male appears to be in NAD, Well developed, Well nourished, alert and cooperative Respiratory: Respirations even and unlabored. Lungs clear to auscultation bilaterally.   No wheezes, crackles, or rhonchi.  Cardiovascular: Normal S1, S2. No MRG. Regular rate and rhythm. No peripheral edema, cyanosis or pallor.  Gastrointestinal:  Soft, nondistended, mild epigastric tttp No rebound or guarding. Normal bowel sounds. No appreciable masses or hepatomegaly. Psychiatric:  Demonstrates good judgement and reason without abnormal affect or behaviors.  MOST RECENT LABS AND IMAGING: CBC    Component Value Date/Time   WBC 4.9 05/06/2016 0825   RBC 4.81 05/06/2016 0825   HGB 15.0 05/06/2016 0825   HCT 44.9 05/06/2016 0825   HCT 45.4 04/05/2016 1536   PLT 177 05/06/2016 0825   PLT 203 04/05/2016 1536   MCV 93.3 05/06/2016 0825   MCV 94 04/05/2016 1536   MCH 31.2 05/06/2016 0825   MCHC 33.4 05/06/2016 0825   RDW 12.5 05/06/2016 0825   RDW 13.4 04/05/2016 1536   LYMPHSABS 2.6 04/05/2016 1536   MONOABS 0.6 04/29/2014 0839   EOSABS 0.1 04/05/2016 1536   BASOSABS 0.0 04/05/2016 1536    CMP     Component Value Date/Time   NA 137 05/06/2016 0825   NA 140 04/05/2016 1536   K 3.8 05/06/2016 0825   CL 107 05/06/2016 0825   CO2 24 05/06/2016 0825   GLUCOSE 106 (H) 05/06/2016 0825   BUN 17 05/06/2016 0825   BUN 22 (H) 04/05/2016 1536   CREATININE 0.76 05/06/2016 0825   CREATININE 0.75 04/29/2014 0839   CALCIUM 9.0 05/06/2016 0825     PROT 6.9 05/06/2016 0825   PROT 7.4 04/05/2016 1536   ALBUMIN 4.1 05/06/2016 0825   ALBUMIN 4.7 04/05/2016 1536   AST 22 05/06/2016 0825   ALT 23 05/06/2016 0825   ALKPHOS 52 05/06/2016 0825   BILITOT 0.8 05/06/2016 0825   BILITOT 0.3 04/05/2016 1536   GFRNONAA >60 05/06/2016 0825   GFRAA >60 05/06/2016 0825    Assessment: 1. Epigastric pain: Persistent epigastric pain, patient has been off all medications for the past few months, recent H. pylori fecal antigen negative, recent EGD showing gastritis, patient is now status post treatment in September of last year  for H. Pylori; likely this represents continued gastritis which is now chronic for the patient  Plan: 1. We did discuss gastritis and how it can be chronic for a long portion of today's appointment. The patient was somewhat persistent in wanting a repeat EGD to "really see what is happening in there", but I reassured him today that we did not need to repeat this at this time. Would recommend that we restart medication and if he continues with symptoms, then at that point we can discuss repeat EGD. 2. Started patient on Omeprazole 20 mg twice a day, 30-60 minutes before breakfast and dinner. 3. Patient was somewhat worried about side effects from medication above. We did discuss this. 4. Patient to return to clinic in 1-2 months with Dr. Marlan PalauPyrtle  Nikeya Maxim, PA-C [facility] 01/02/2017, 10:18 AM  Cc: Julieanne MansonMulberry, Elizabeth, MD   Addendum: Reviewed and agree with management. Beverley FiedlerJay M Pyrtle, MD

## 2017-01-02 NOTE — Patient Instructions (Signed)
Opciones de alimentos para pacientes con reflujo gastroesofgico - Adultos (Food Choices for Gastroesophageal Reflux Disease, Adult) Cuando se tiene reflujo gastroesofgico (ERGE), los alimentos que se ingieren y los hbitos de alimentacin son muy importantes. Elegir los alimentos adecuados puede ayudar a aliviar las molestias ocasionadas por el ERGE. QU PAUTAS GENERALES DEBO SEGUIR?  Elija las frutas, los vegetales, los cereales integrales, los productos lcteos, la carne de vaca, de pescado y de ave con bajo contenido de grasas.  Limite las grasas, como los aceites, los aderezos para ensalada, la manteca, los frutos secos y el aguacate.  Lleve un registro de las comidas para identificar los alimentos que ocasionan sntomas.  Evite los alimentos que le ocasionen reflujo. Pueden ser distintos para cada persona.  Haga comidas pequeas con frecuencia en lugar de tres comidas abundantes todos los das.  Coma lentamente, en un clima distendido.  Limite el consumo de alimentos fritos.  Cocine los alimentos utilizando mtodos que no sean la fritura.  Evite el consumo alcohol.  Evite beber grandes cantidades de lquidos con las comidas.  Evite agacharse o recostarse hasta despus de 2 o 3horas de haber comido.  QU ALIMENTOS NO SE RECOMIENDAN? Los siguientes son algunos alimentos y bebidas que pueden empeorar los sntomas: Vegetales Tomates. Jugo de tomate. Salsa de tomate y espagueti. Ajes. Cebolla y ajo. Rbano picante. Frutas Naranjas, pomelos y limn (fruta y jugo). Carnes Carnes de vaca, de pescado y de ave con gran contenido de grasas. Esto incluye los perros calientes, las costillas, el jamn, la salchicha, el salame y el tocino. Lcteos Leche entera y leche chocolatada. Crema cida. Crema. Mantequilla. Helados. Queso crema. Bebidas Caf y t negro, con o sin cafena Bebidas gaseosas o energizantes. Condimentos Salsa picante. Salsa barbacoa. Dulces/postres Chocolate y  cacao. Rosquillas. Menta y mentol. Grasas y aceites Alimentos con alto contenido de grasas, incluidas las papas fritas. Otros Vinagre. Especias picantes, como la pimienta negra, la pimienta blanca, la pimienta roja, la pimienta de cayena, el curry en polvo, los clavos de olor, el jengibre y el chile en polvo. Los artculos mencionados arriba pueden no ser una lista completa de las bebidas y los alimentos que se deben evitar. Comunquese con el nutricionista para recibir ms informacin. Esta informacin no tiene como fin reemplazar el consejo del mdico. Asegrese de hacerle al mdico cualquier pregunta que tenga. Document Released: 07/11/2005 Document Revised: 10/22/2014 Document Reviewed: 08/05/2013 Elsevier Interactive Patient Education  2017 Elsevier Inc.  

## 2017-02-04 ENCOUNTER — Encounter: Payer: Self-pay | Admitting: *Deleted

## 2017-02-18 ENCOUNTER — Ambulatory Visit (INDEPENDENT_AMBULATORY_CARE_PROVIDER_SITE_OTHER): Payer: Self-pay | Admitting: Internal Medicine

## 2017-02-18 ENCOUNTER — Encounter: Payer: Self-pay | Admitting: Internal Medicine

## 2017-02-18 VITALS — BP 116/70 | HR 93 | Ht 68.0 in | Wt 199.0 lb

## 2017-02-18 DIAGNOSIS — R1013 Epigastric pain: Secondary | ICD-10-CM

## 2017-02-18 DIAGNOSIS — Z8619 Personal history of other infectious and parasitic diseases: Secondary | ICD-10-CM

## 2017-02-18 MED ORDER — OMEPRAZOLE 20 MG PO CPDR: DELAYED_RELEASE_CAPSULE | ORAL | 3 refills | Status: DC

## 2017-02-18 MED FILL — OMEPRAZOLE 20 MG CAPSULE DR: 20 | 30 days supply | Qty: 60 | Fill #1

## 2017-02-18 NOTE — Progress Notes (Signed)
Subjective:    Patient ID: Frank Wilcox, male    DOB: 1981/04/15, 36 y.o.   MRN: [id number]  HPI Frank Wilcox is a 36 yo male with a history of dyspepsia and H. pylori gastritis who is here for follow-up. He was last seen about 6 weeks ago by Hyacinth MeekerJennifer Lemmon, PA-C. He was seen in September for epigastric abdominal pain and upper endoscopy was repeated at that time. He is here today along with a medical Spanish interpreter present. This showed patchy inflammation in the gastric fundus and antrum. Biopsies showed H. pylori. He did have erosions in the duodenal bulb. He was treated with Pylera. He had recurrent epigastric type symptoms with nausea, gnawing discomfort when he was seen in March 2018. H. pylori stool antigen was negative and he was resumed on PPI.  Today he reports this definitively helps his epigastric pain. He is using omeprazole 20 mg before breakfast and at bedtime. He has also avoided certain trigger foods such as coffee, alcohol and spicy foods particularly chili peppers. He will occasionally have some mild epigastric discomfort. He also reports frequent lower intestinal gas.  He is assisted in repeating H. pylori treatment because he reports this makes "everything better". It tends to help his GI and an intestinal gas as well as occasional bloating.  Bowel movements have been regular he's having 1 formed stool daily without in his stool or melena.   Review of Systems As per history of present illness, otherwise negative  Current Medications, Allergies, Past Medical History, Past Surgical History, Family History and Social History were reviewed in [facility] electronic [facility] record.     Objective:   Physical Exam BP 116/70   Pulse 93   Ht 5\' 8"  (1.727 m)   Wt 199 lb (90.3 kg)   BMI 30.26 kg/m  Constitutional: Well-developed and well-nourished. No distress. HEENT: Normocephalic and atraumatic. Oropharynx is clear and moist. Conjunctivae are normal.  No  scleral icterus. Neck: Neck supple. Trachea midline. Cardiovascular: Normal rate, regular rhythm and intact distal pulses. No M/R/G Pulmonary/chest: Effort normal and breath sounds normal. No wheezing, rales or rhonchi. Abdominal: Soft, nontender, nondistended. Bowel sounds active throughout. There are no masses palpable. No hepatosplenomegaly. Extremities: no clubbing, cyanosis, or edema Neurological: Alert and oriented to person place and time. Skin: Skin is warm and dry. Psychiatric: Normal mood and affect. Behavior is normal.      Assessment & Plan:   36 yo male with a history of dyspepsia and H. pylori gastritis who is here for follow-up.  1. Dyspepsia with history of H. Pylori -- he has a component of functional dyspepsia but also has had H. pylori. H. pylori has been eradicated as recently confirmed by stool antigen. He clearly does better on PPI. He is using omeprazole 20 mg twice a day but I encouraged him to move the second daily dose to before dinner rather than before bedtime. We will continue this medication. I expect the antibiotics help with his bloating by way of intestinal bacteria. I will have him begin a low FODMAP diet (Spanish version given).  I will also add FDgard 2 capsules BIDPRN.  This can be used on an as-needed basis for his dyspeptic symptoms. Reassurance provided today. If symptoms fail to improve particular from a gas and bloating standpoint I would consider a probiotic prior to retreatment with antibiotics.  Return in 3-4 months, sooner if necessary  25 minutes spent with the patient today. Greater than 50% was spent in counseling and  coordination of care with the patient

## 2017-02-18 NOTE — Patient Instructions (Addendum)
We have sent the following medications to your pharmacy for you to pick up at your convenience: Omeprazole 20 mg before breakfast and before dinner  We have given you a FODMAP diet to look over and follow.  We have given you samples of FDGard to take 2 tablets by mouth twice daily as needed. If this works well, you may purchase this over the counter.  Please follow up with Dr Rhea BeltonPyrtle in 3-4 months.  If you are age 35 or older, your body mass index should be between 23-30. Your Body mass index is 30.26 kg/m. If this is out of the aforementioned range listed, please consider follow up with your Primary Care Provider.  If you are age 664 or younger, your body mass index should be between 19-25. Your Body mass index is 30.26 kg/m. If this is out of the aformentioned range listed, please consider follow up with your Primary Care Provider.

## 2017-06-19 ENCOUNTER — Other Ambulatory Visit: Payer: Self-pay | Admitting: Physician Assistant

## 2017-06-19 MED FILL — OMEPRAZOLE 20 MG CAP: 20 | 90 days supply | Qty: 180 | Fill #0

## 2018-03-24 ENCOUNTER — Encounter (INDEPENDENT_AMBULATORY_CARE_PROVIDER_SITE_OTHER): Payer: Self-pay | Admitting: Physician Assistant

## 2018-03-24 ENCOUNTER — Ambulatory Visit (INDEPENDENT_AMBULATORY_CARE_PROVIDER_SITE_OTHER): Payer: Self-pay | Admitting: Physician Assistant

## 2018-03-24 VITALS — BP 96/61 | HR 50 | Temp 97.8°F | Resp 18 | Ht 70.0 in | Wt 210.0 lb

## 2018-03-24 DIAGNOSIS — K5900 Constipation, unspecified: Secondary | ICD-10-CM

## 2018-03-24 DIAGNOSIS — F411 Generalized anxiety disorder: Secondary | ICD-10-CM

## 2018-03-24 DIAGNOSIS — Z131 Encounter for screening for diabetes mellitus: Secondary | ICD-10-CM

## 2018-03-24 LAB — POCT GLYCOSYLATED HEMOGLOBIN (HGB A1C): HEMOGLOBIN A1C: 4.9 % (ref 4.0–5.6)

## 2018-03-24 MED ORDER — SERTRALINE HCL 25 MG PO TABS: 25.0000 mg | ORAL_TABLET | Freq: Every day | ORAL | 2 refills | Status: DC

## 2018-03-24 NOTE — Patient Instructions (Addendum)
Trastorno de ansiedad generalizada, en adultos  Generalized Anxiety Disorder, Adult  El trastorno de ansiedad generalizada (TAG) es un trastorno de salud mental. Las personas con esta afeccin se preocupan constantemente por los eventos de todos los das. A diferencia de la ansiedad normal, la preocupacin relacionada con el TAG no se produce por un evento especfico. Estas preocupaciones tampoco desaparecen ni mejoran con el tiempo. EL TAG interfiere con las funciones de la vida, incluidas las relaciones, el trabajo y la escuela.  El TAG puede variar de leve a grave. Las personas con TAG grave tienen intensas oleadas de ansiedad con sntomas fsicos (crisis de angustia).  Cules son las causas?  Se desconoce la causa exacta del TAG.  Qu incrementa el riesgo?  Es ms probable que esta afeccin se manifieste en:   Mujeres.   Las personas que tienen antecedentes familiares de trastornos de ansiedad.   Las personas que son muy tmidas.   Las personas que experimentan eventos muy estresantes en la vida, tales como la muerte de un ser querido.   Las personas que tienen un entorno familiar muy estresante.    Cules son los signos o los sntomas?  Con frecuencia, las personas que padecen el TAG se preocupan excesivamente por muchas cosas en la vida, tales como su salud y su familia. Tambin pueden experimentar una preocupacin desmedida por lo siguiente:   Hacer las cosas bien.   Llegar a tiempo.   Los desastres naturales.   Las amistades.    Los sntomas fsicos del TAG incluyen:   Fatiga.   Tensin muscular o contracciones musculares.   Temblores o agitacin.   Sobresaltarse con facilidad.   Sentir que el corazn late fuerte o est acelerado.   Sentir falta de aire o como que no se puede respirar profundamente.   Problemas para quedarse dormido o para seguir durmiendo.   Sudoracin.   Nuseas, diarrea o sndrome del intestino irritable (SII).   Dolores de cabeza.   Dificultad para concentrarse o  recordar hechos.   Agitacin.   Irritabilidad.    Cmo se diagnostica?  El mdico puede diagnosticar el TAG en funcin de los sntomas y los antecedentes mdicos. Se le realizar un examen fsico. El mdico le har preguntas especficas sobre sus sntomas, que incluyen qu tan graves son, cundo comenzaron y si van y vienen. Tambin puede hacerle preguntas sobre el consumo de alcohol o drogas, incluidos los medicamentos recetados. Su mdico podr derivarlo a un especialista de salud mental para ms evaluaciones.  Su mdico le realizar un examen exhaustivo y puede hacer pruebas adicionales para descartar otras posibles causas de sus sntomas.  Para recibir un diagnstico del TAG, una persona debe tener ansiedad que:   Est fuera de su control.   Afecte distintos aspectos de su vida, como el trabajo y las relaciones.   Cause angustia que le impida participar en sus actividades habituales.   Incluya al menos tres sntomas fsicos del TAG tales como inquietud, fatiga, dificultad para concentrarse, irritabilidad, tensin muscular o problemas para dormir.    Antes de que su mdico pueda confirmar un diagnstico del TAG, estos sntomas deben estar presentes ms das de los que no lo estn y deben tener una duracin de seis meses o ms.  Cmo se trata?  Las siguientes terapias se usan generalmente para tratar el TAG:   Medicamentos. Por lo general, los medicamentos antidepresivos se recetan para un control diario a largo plazo. Se pueden agregar medicamentos para la ansiedad en casos   para Saks Incorporatedaliviar la ansiedad. Aprenden a identificar conductas y pensamientos  irreales o negativos, y a reemplazarlos por positivos. ? Terapia de aceptacin y compromiso (acceptance and commitment therapy, ACT). Este tratamiento ensea a las personas a ser conscientes como una forma de lidiar con pensamientos y sentimientos no deseados. ? Biorretroalimentacin. Este proceso lo capacita para Scientist, physiologicalcontrolar la respuesta del cuerpo Publishing copy(respuesta psicolgica) a travs de tcnicas de respiracin y mtodos de relajacin. Usted trabajar con un terapeuta mientras se usan mquinas para controlar sus sntomas fsicos.  Tcnicas para Charity fundraisercontrolar el estrs. Estas incluyen yoga, meditacin y ejercicio.  Un especialista en salud mental puede ayudar a determinar qu tratamiento es el mejor para usted. Algunas Naval architectpersonas pueden mejorar con solo un tipo de Suttonterapia. Sin embargo, Economistotras personas requieren una combinacin de terapias. Siga estas instrucciones en su casa:  Tome los medicamentos de venta libre y los recetados solamente como se lo haya indicado el mdico.  Trate de mantener una rutina normal.  Trate de anticipar situaciones estresantes y permita un tiempo adicional para controlarlas.  Participe de cualquier tcnica para autocalmarse o controlar el estrs segn las indicaciones de su mdico.  No se castigue ante los retrocesos o por no Sports coachrealizar progresos.  Trate de reconocer sus logros aunque sean pequeos.  Concurra a todas las visitas de [address]wayseguimiento como se lo haya indicado el mdico. Esto es importante. Comunquese con un mdico si:  Los sntomas no mejoran.  Los sntomas empeoran.  Tiene signos de depresin tales como: ? Tristeza, mal humor o irritabilidad. ? Ya no disfruta de Industrial/product designeractividades que le solan causar placer. ? Cambios en el peso y o en sus hbitos de alimentacin. ? Cambios en los hbitos de sueo. ? Evita a amigos y familiares. ? No tiene energa para realizar las tareas habituales. ? Tiene sentimientos de culpa o de minusvala. Solicite ayuda de inmediato  si:  Tiene pensamientos serios acerca de lastimarse a usted mismo o daar a Economistotras personas. Si alguna vez siente que puede lastimarse o Physicist, medicallastimar a los dems, o piensa en poner fin a su vida, busque ayuda de inmediato. Puede dirigirse al servicio de urgencias ms cercano o comunicarse con:  El servicio de Sports administratoremergencias de su localidad (911 en los [place]).  Una lnea de asistencia al suicida y Visual merchandiseratencin en crisis, como la Murphy OilLnea Nacional de Prevencin del Suicidio (National Suicide Prevention Lifeline) al [phone]. Est disponible las 24 horas del da.  Resumen  El trastorno de ansiedad generalizada (TAG) es un trastorno de salud mental que implica preocupacin no causada por un evento especfico.  Con frecuencia, las personas que padecen el TAG se preocupan excesivamente por muchas cosas en la vida, tales como su salud y [place]u familia.  El TAG puede causar sntomas fsicos tales como inquietud, dificultad para concentrarse, problemas para dormir, sudoracin frecuente, nuseas, diarrea, dolores de Turkmenistancabeza y temblores, o contracciones musculares.  Un especialista en salud mental puede ayudar a determinar qu tratamiento es el mejor para usted. Algunas Naval architectpersonas pueden mejorar con solo un tipo de Lidderdaleterapia. Sin embargo, Economistotras personas requieren una combinacin de terapias. Esta informacin no tiene Theme park managercomo fin reemplazar el consejo del mdico. Asegrese de hacerle al mdico cualquier pregunta que tenga. Document Released: 01/26/2013 Document Revised: 01/04/2017 Document Reviewed: 01/04/2017 Elsevier Interactive Patient Education  2018 [employer]vier Inc.   Opciones de alimentos para pacientes con reflujo gastroesofgico - Adultos (Food Choices for Gastroesophageal Reflux Disease, Adult) Cuando se tiene reflujo gastroesofgico (ERGE), los alimentos que se ingieren y los hbitos  de alimentacin son Engineer, production. Elegir los alimentos adecuados puede ayudar a [employer]. QU PAUTAS DEBO  SEGUIR?  Elija las frutas, los vegetales, los cereales integrales y los productos lcteos con bajo contenido de [place].  Elija las carnes de S[place], de pescado y de ave con bajo contenido de grasas.  Limite las grasas, [address] [place], los aderezos para [place], la [place], los frutos secos y Programme researcher, broadcasting/film/video.  Lleve un registro de alimentos. Esto ayuda a identificar los alimentos que ocasionan sntomas.  Evite los alimentos que le ocasionen sntomas. Pueden ser distintos para cada persona.  Haga comidas pequeas durante Glass blower/designer de 3 comidas abundantes.  Coma lentamente, en un lugar donde est distendido.  Limite el consumo de alimentos fritos.  Cocine los alimentos utilizando mtodos que no sean la fritura.  Evite el consumo alcohol.  Evite beber grandes cantidades de lquidos con las comidas.  Evite agacharse o recostarse hasta despus de 2 o 3horas de haber comido.  QU ALIMENTOS NO SE RECOMIENDAN? Estos son algunos alimentos y bebidas que pueden empeorar los sntomas: Veterinary surgeon. Jugo de tomate. Salsa de tomate y espagueti. Ajes. Cebolla y [place]. Rbano picante. Frutas Naranjas, pomelos y limn (fruta y [place]). Carnes Carnes de [place], de pescado y de ave con gran contenido de grasas. Esto incluye los perros calientes, las [place], el [place], la salchicha, el salame y el tocino. Lcteos Leche entera y [place]. [employer]PG [employer]. Crema. Mantequilla. Helados. Queso crema. Bebidas T o caf. Bebidas gaseosas o bebidas energizantes. Condimentos Salsa picante. Salsa barbacoa. Dulces/postres Chocolate y cacao. Rosquillas. Menta y mentol. Grasas y Du Pont. Esto incluye las papas fritas. Otros Vinagre. Especias picantes. Esto incluye la pimienta negra, la pimienta blanca, la pimienta roja, la pimienta de cayena, el curry en polvo, los clavos de [place], el jengibre y el [place] en polvo. Esta no es Raytheon de los alimentos y las bebidas  que se Theatre stage manager. Comunquese con el nutricionista para recibir ms informacin. Esta informacin no tiene Theme park manager el consejo del mdico. Asegrese de hacerle al mdico cualquier pregunta que tenga. Document Released: 04/01/2012 Document Revised: 10/22/2014 Document Reviewed: 08/05/2013 Elsevier Interactive Patient Education  2017 [employer]itor.

## 2018-03-24 NOTE — Progress Notes (Signed)
Subjective:  Patient ID: Frank Wilcox, male    DOB: 04-Feb-1981  Age: 37 y.o. MRN: [id number]  CC: hemmorhoid  HPI Frank Wilcox is a 37 y.o. male with a medical history of alcohol abuse, anxiety, gastritis, PUD, sleep disturbance, and acid reflux presents as a new patient with concern for hemorrhoid. Self medicated with suppositories and is feeling better without any further rectal bleeding. Usually suffers with constipation. He is concerned that he may have an illness. No family members with hx of bowel disease/cancer. States that he worries in excess about everyday activities and considers himself a nervous person. Does not take any SSRI/SNRI. PHQ9 six and GAD7 four in clinic today. No suicidal ideation/intent. Does not endorse any other complaints or symptoms.       Outpatient Medications Prior to Visit  Medication Sig Dispense Refill  . omeprazole (PRILOSEC) 20 MG capsule Take 1 tablet by mouth before breakfast and 1 tablet before dinner 60 capsule 3   Facility-Administered Medications Prior to Visit  Medication Dose Route Frequency Provider Last Rate Last Dose  . 0.9 %  sodium chloride infusion  500 mL Intravenous Continuous Pyrtle, Carie CaddyJay M, MD         ROS Review of Systems  Constitutional: Negative for chills, fever and malaise/fatigue.  Eyes: Negative for blurred vision.  Respiratory: Negative for shortness of breath.   Cardiovascular: Negative for chest pain and palpitations.  Gastrointestinal: Negative for abdominal pain and nausea.       Episode of anal bleeding  Genitourinary: Negative for dysuria and hematuria.  Musculoskeletal: Negative for joint pain and myalgias.  Skin: Negative for rash.  Neurological: Negative for tingling and headaches.  Psychiatric/Behavioral: Negative for depression. The patient is not nervous/anxious.     Objective:  There were no vitals taken for this visit.  BP/Weight 02/18/2017 01/02/2017 07/05/2016  Systolic BP 116  621100 99  Diastolic BP 70 60 60  Wt. (Lbs) 199 201.4 203  BMI 30.26 30.62 30.87      Physical Exam  Constitutional: He is oriented to person, place, and time.  Well developed, well nourished, NAD, polite  HENT:  Head: Normocephalic and atraumatic.  Eyes: No scleral icterus.  Neck: Normal range of motion. Neck supple. No thyromegaly present.  Cardiovascular: Normal rate, regular rhythm, normal heart sounds and intact distal pulses. Exam reveals no gallop and no friction rub.  No murmur heard. Pulmonary/Chest: Effort normal and breath sounds normal.  Abdominal: Soft. He exhibits no distension and no mass. There is no tenderness. There is no rebound and no guarding. No hernia.  Hypoactive bowel sounds  Genitourinary:  Genitourinary Comments: Small area of erythema in the anus, appears to be a healing fissure.  Musculoskeletal: He exhibits no edema.  Neurological: He is alert and oriented to person, place, and time.  Skin: Skin is warm and dry. No rash noted. No erythema. No pallor.  Psychiatric: His behavior is normal. Thought content normal.  Somewhat anxious, full affect, thoughts linear, normal rate of speech  Vitals reviewed.    Assessment & Plan:   1. Generalized anxiety disorder - Begin Sertraline 25 mg one tablet po qday x30 days 2 refills. - TSH - CBC with Differential - Comprehensive metabolic panel  2. Constipation, unspecified constipation type - Advised to eat more fiber - Begin sertraline 25 mg - TSH - CBC with Differential - Comprehensive metabolic panel  3. Screening for diabetes mellitus - HgB A1c 4.9% today.   Meds ordered this encounter  Medications  .  sertraline (ZOLOFT) 25 MG tablet    Sig: Take 1 tablet (25 mg total) by mouth daily.    Dispense:  30 tablet    Refill:  2    Order Specific Question:   Supervising Provider    Answer:   Hoy Register [[id number]]    Follow-up: Return in about 6 weeks (around 05/05/2018) for anxiety.   Loletta Specter PA

## 2018-03-25 LAB — CBC WITH DIFFERENTIAL/PLATELET
BASOS: 1 %
Basophils Absolute: 0 10*3/uL (ref 0.0–0.2)
EOS (ABSOLUTE): 0.1 10*3/uL (ref 0.0–0.4)
EOS: 1 %
HEMATOCRIT: 47 % (ref 37.5–51.0)
HEMOGLOBIN: 15.5 g/dL (ref 13.0–17.7)
IMMATURE GRANS (ABS): 0 10*3/uL (ref 0.0–0.1)
IMMATURE GRANULOCYTES: 0 %
LYMPHS: 37 %
Lymphocytes Absolute: 2.9 10*3/uL (ref 0.7–3.1)
MCH: 31 pg (ref 26.6–33.0)
MCHC: 33 g/dL (ref 31.5–35.7)
MCV: 94 fL (ref 79–97)
Monocytes Absolute: 0.5 10*3/uL (ref 0.1–0.9)
Monocytes: 7 %
NEUTROS ABS: 4.2 10*3/uL (ref 1.4–7.0)
NEUTROS PCT: 54 %
Platelets: 213 10*3/uL (ref 150–450)
RBC: 5 x10E6/uL (ref 4.14–5.80)
RDW: 13.7 % (ref 12.3–15.4)
WBC: 7.8 10*3/uL (ref 3.4–10.8)

## 2018-03-25 LAB — COMPREHENSIVE METABOLIC PANEL
A/G RATIO: 1.8 (ref 1.2–2.2)
ALBUMIN: 4.8 g/dL (ref 3.5–5.5)
ALT: 30 IU/L (ref 0–44)
AST: 19 IU/L (ref 0–40)
Alkaline Phosphatase: 61 IU/L (ref 39–117)
BILIRUBIN TOTAL: 0.4 mg/dL (ref 0.0–1.2)
BUN / CREAT RATIO: 23 — AB (ref 9–20)
BUN: 19 mg/dL (ref 6–20)
CALCIUM: 9.9 mg/dL (ref 8.7–10.2)
CHLORIDE: 100 mmol/L (ref 96–106)
CO2: 24 mmol/L (ref 20–29)
Creatinine, Ser: 0.81 mg/dL (ref 0.76–1.27)
GFR, EST AFRICAN AMERICAN: 133 mL/min/{1.73_m2} (ref >59)
GFR, EST NON AFRICAN AMERICAN: 115 mL/min/{1.73_m2} (ref >59)
GLOBULIN, TOTAL: 2.6 g/dL (ref 1.5–4.5)
Glucose: 77 mg/dL (ref 65–99)
POTASSIUM: 4.3 mmol/L (ref 3.5–5.2)
Sodium: 139 mmol/L (ref 134–144)
TOTAL PROTEIN: 7.4 g/dL (ref 6.0–8.5)

## 2018-03-25 LAB — TSH: TSH: 1.56 u[IU]/mL (ref 0.450–4.500)

## 2018-03-28 ENCOUNTER — Telehealth (INDEPENDENT_AMBULATORY_CARE_PROVIDER_SITE_OTHER): Payer: Self-pay

## 2018-03-28 NOTE — Telephone Encounter (Signed)
-----   Message from Loletta Specteroger David Gomez, PA-C sent at 03/25/2018 12:32 PM EDT ----- Labs normal.

## 2018-03-28 NOTE — Telephone Encounter (Signed)
Pacific interpreter Valera CastleValentina [phone]) notified patient that all labs are normal. Maryjean Mornempestt S Roberts, CMA

## 2018-04-04 ENCOUNTER — Ambulatory Visit: Payer: Self-pay | Attending: Physician Assistant

## 2018-05-09 ENCOUNTER — Ambulatory Visit (INDEPENDENT_AMBULATORY_CARE_PROVIDER_SITE_OTHER): Payer: Self-pay | Admitting: Physician Assistant

## 2018-06-04 ENCOUNTER — Ambulatory Visit (INDEPENDENT_AMBULATORY_CARE_PROVIDER_SITE_OTHER): Payer: Self-pay | Admitting: Physician Assistant

## 2018-06-09 ENCOUNTER — Ambulatory Visit (INDEPENDENT_AMBULATORY_CARE_PROVIDER_SITE_OTHER): Payer: Self-pay | Admitting: Physician Assistant

## 2018-06-09 ENCOUNTER — Encounter (INDEPENDENT_AMBULATORY_CARE_PROVIDER_SITE_OTHER): Payer: Self-pay | Admitting: Physician Assistant

## 2018-06-09 ENCOUNTER — Other Ambulatory Visit: Payer: Self-pay

## 2018-06-09 VITALS — BP 111/69 | HR 67 | Temp 98.2°F | Ht 69.0 in | Wt 213.0 lb

## 2018-06-09 DIAGNOSIS — G8929 Other chronic pain: Secondary | ICD-10-CM

## 2018-06-09 DIAGNOSIS — F411 Generalized anxiety disorder: Secondary | ICD-10-CM

## 2018-06-09 DIAGNOSIS — M7711 Lateral epicondylitis, right elbow: Secondary | ICD-10-CM

## 2018-06-09 DIAGNOSIS — M25571 Pain in right ankle and joints of right foot: Secondary | ICD-10-CM

## 2018-06-09 MED ORDER — NAPROXEN 500 MG PO TABS: 500.0000 mg | ORAL_TABLET | Freq: Two times a day (BID) | ORAL | 0 refills | Status: DC

## 2018-06-09 MED ORDER — SERTRALINE HCL 25 MG PO TABS: 25.0000 mg | ORAL_TABLET | Freq: Every day | ORAL | 2 refills | Status: DC

## 2018-06-09 NOTE — Patient Instructions (Signed)
Codo de tenista  (Tennis Elbow)  El codo de tenista es la hinchazn (inflamacin) de los tendones externos del antebrazo cerca del codo. Los tendones unen los msculos con los huesos. Esta afeccin puede presentarse si practica un deporte o realiza una tarea que exige el uso excesivo del codo. La causa del codo de tenista es la repeticin del mismo movimiento una y otra vez. El codo de tenista puede causar lo siguiente:   Dolor y sensibilidad a la palpacin en el antebrazo y la parte externa del codo.   Sensacin de ardor que se extiende desde el codo hasta el brazo.   Agarre dbil de las manos.  CUIDADOS EN EL HOGAR  Actividad   Ponga el codo y la mueca en reposo como se lo haya indicado el mdico. Intente no realizar ninguna actividad que pueda haber causado el problema hasta que el mdico le permita reanudarla.   Si un fisioterapeuta le ensea ejercicios, hgalos como se lo hayan indicado.   Si levanta un objeto, hgalo con la palma de la mano hacia arriba. De este modo, se reduce el esfuerzo en el codo.  Estilo de vida   Si el codo de tenista se debe a los deportes, revise el equipo y asegrese de lo siguiente:  ? Que lo usa correctamente.  ? Que es apto para usted.   Si el codo de tenista se debe al trabajo, tmese descansos con frecuencia, si es posible. Hable con el gerente respecto de hacer su trabajo de un modo que sea seguro para usted.  ? Si el codo de tenista se debe al uso de la computadora, hable con el gerente sobre los cambios que pueden hacerse en su lugar de trabajo.  Instrucciones generales   Si se lo indican, aplique hielo sobre la zona dolorida:  ? Ponga el hielo en una bolsa plstica.  ? Coloque una toalla entre la piel y la bolsa de hielo.  ? Coloque el hielo durante 20minutos, 2 a 3veces por da.   Tome los medicamentos solamente como se lo haya indicado el mdico.   Si le aconsejaron usar un dispositivo ortopdico, selo como se lo haya indicado el mdico.   Concurra a todas  las visitas de control como se lo haya indicado el mdico. Esto es importante.  SOLICITE AYUDA SI:   El dolor no mejora con el tratamiento.   El dolor empeora.   Tiene debilidad en el antebrazo, la mano o los dedos de la mano.   No puede sentir el antebrazo, la mano o los dedos de la mano.  Esta informacin no tiene como fin reemplazar el consejo del mdico. Asegrese de hacerle al mdico cualquier pregunta que tenga.  Document Released: 11/03/2010 Document Revised: 02/15/2015 Document Reviewed: 09/27/2014  Elsevier Interactive Patient Education  2018 Elsevier Inc.

## 2018-06-09 NOTE — Progress Notes (Signed)
Subjective:  Patient ID: Frank Wilcox, male    DOB: [birth year]  Age: 37 y.o. MRN: [id number]  CC: f/u anxiety  HPI Frank Wilcox is a 37 y.o. male with a medical history of alcohol abuse, anxiety, gastritis, PUD, sleep disturbance, and acid reflux presents on f/u of GAD and constipation. Prescribed Sertraline 25 mg. Reports anxiety as being persistent and attributed to work stress. Says his constipation is much better now. PHQ9 1 and GAD7 0 today.    Complains of right elbow pain since one month ago. Works in Regulatory affairs officer. Says he regularly carries heavy items. Has not taken anything for relief but has applied OTC pain relief gel with some mild to moderate relief.     Right ankle pain since 2007 after a fall from a ladder. Had surgery done with installation of hardware. Pt reports chronic pain that is progressing over the years. Requests an x-ray to help sort out the cause of his pain. Does not endorse any other associated symptoms.      Outpatient Medications Prior to Visit  Medication Sig Dispense Refill  . sertraline (ZOLOFT) 25 MG tablet Take 1 tablet (25 mg total) by mouth daily. 30 tablet 2  . omeprazole (PRILOSEC) 20 MG capsule Take 1 tablet by mouth before breakfast and 1 tablet before dinner (Patient not taking: Reported on 06/09/2018) 60 capsule 3   Facility-Administered Medications Prior to Visit  Medication Dose Route Frequency Provider Last Rate Last Dose  . 0.9 %  sodium chloride infusion  500 mL Intravenous Continuous Pyrtle, Carie Caddy, MD         ROS Review of Systems  Constitutional: Negative for chills, fever and malaise/fatigue.  Eyes: Negative for blurred vision.  Respiratory: Negative for shortness of breath.   Cardiovascular: Negative for chest pain and palpitations.  Gastrointestinal: Negative for abdominal pain and nausea.  Genitourinary: Negative for dysuria and hematuria.  Musculoskeletal: Positive for joint pain. Negative  for myalgias.  Skin: Negative for rash.  Neurological: Negative for tingling and headaches.  Psychiatric/Behavioral: Negative for depression. The patient is not nervous/anxious.     Objective:  BP 111/69 (BP Location: Left Arm, Patient Position: Sitting, Cuff Size: Large)   Pulse 67   Temp 98.2 F (36.8 C) (Oral)   Ht 5\' 9"  (1.753 m)   Wt 213 lb (96.6 kg)   SpO2 96%   BMI 31.45 kg/m   BP/Weight 06/09/2018 03/24/2018 02/18/2017  Systolic BP 111 96 116  Diastolic BP 69 61 70  Wt. (Lbs) 213 210 199  BMI 31.45 30.13 30.26      Physical Exam  Constitutional: He is oriented to person, place, and time.  Well developed, well nourished, NAD, polite  HENT:  Head: Normocephalic and atraumatic.  Eyes: No scleral icterus.  Neck: Normal range of motion. Neck supple. No thyromegaly present.  Cardiovascular: Normal rate, regular rhythm and normal heart sounds.  Pulmonary/Chest: Effort normal and breath sounds normal.  Musculoskeletal: He exhibits no edema.  TTP along the right lateral epicondyle with mild swelling. Full aROM of the RUE.  Neurological: He is alert and oriented to person, place, and time.  Skin: Skin is warm and dry. No rash noted. No erythema. No pallor.  Psychiatric: His behavior is normal. Thought content normal.  Somewhat anxious, unable to sit still for very long with intermittent voluntary movements of legs.   Vitals reviewed.    Assessment & Plan:    1. GAD (generalized anxiety disorder) - Continue sertraline 25  mg qday.  2. Lateral epicondylitis of right elbow - naproxen (NAPROSYN) 500 MG tablet; Take 1 tablet (500 mg total) by mouth 2 (two) times daily with a meal.  Dispense: 30 tablet; Refill: 0 - Advised to wear tennis elbow brace and to reduce repetitive motion and gripping with the RUE.   3. Chronic pain of right ankle - DG Ankle Complete Right; Future   Meds ordered this encounter  Medications  . naproxen (NAPROSYN) 500 MG tablet    Sig: Take 1  tablet (500 mg total) by mouth 2 (two) times daily with a meal.    Dispense:  30 tablet    Refill:  0    Order Specific Question:   Supervising Provider    Answer:   Hoy RegisterNEWLIN, ENOBONG [[id number]]  . sertraline (ZOLOFT) 25 MG tablet    Sig: Take 1 tablet (25 mg total) by mouth daily.    Dispense:  30 tablet    Refill:  2    Order Specific Question:   Supervising Provider    Answer:   Hoy RegisterNEWLIN, ENOBONG [[id number]]    Follow-up: Return in about 12 weeks (around 09/01/2018) for anxiety.   Loletta Specteroger David Gomez PA

## 2018-08-04 ENCOUNTER — Ambulatory Visit (INDEPENDENT_AMBULATORY_CARE_PROVIDER_SITE_OTHER): Payer: Self-pay | Admitting: Physician Assistant

## 2018-08-04 ENCOUNTER — Encounter (INDEPENDENT_AMBULATORY_CARE_PROVIDER_SITE_OTHER): Payer: Self-pay | Admitting: Physician Assistant

## 2018-08-04 ENCOUNTER — Other Ambulatory Visit: Payer: Self-pay

## 2018-08-04 VITALS — BP 112/75 | HR 68 | Temp 97.8°F | Ht 69.0 in | Wt 214.0 lb

## 2018-08-04 DIAGNOSIS — Z23 Encounter for immunization: Secondary | ICD-10-CM

## 2018-08-04 DIAGNOSIS — F1010 Alcohol abuse, uncomplicated: Secondary | ICD-10-CM

## 2018-08-04 DIAGNOSIS — Z79899 Other long term (current) drug therapy: Secondary | ICD-10-CM

## 2018-08-04 DIAGNOSIS — L299 Pruritus, unspecified: Secondary | ICD-10-CM

## 2018-08-04 MED ORDER — NALTREXONE HCL 50 MG PO TABS: 50.0000 mg | ORAL_TABLET | Freq: Every day | ORAL | 0 refills | Status: DC

## 2018-08-04 MED ORDER — OMEPRAZOLE 20 MG PO CPDR: DELAYED_RELEASE_CAPSULE | ORAL | 3 refills | Status: DC

## 2018-08-04 MED ORDER — HYDROXYZINE HCL 25 MG PO TABS: 25.0000 mg | ORAL_TABLET | Freq: Every day | ORAL | 0 refills | Status: DC

## 2018-08-04 NOTE — Progress Notes (Signed)
Subjective:  Patient ID: Frank Wilcox, male    DOB: 07/10/1981  Age: 37 y.o. MRN: [id number]  CC: itching in face and arms  HPI Frank Castillo-Guevarais a 36 y.o.malewith a medical history of alcohol abuse, anxiety, gastritis, PUD, sleep disturbance, and acid reflux presents with one week history of itching in all of the body. Worse four days ago but self resolving over time. There was one episode of nausea four to five days ago. No lesions, erythema, swelling, fever, chills, vomiting. Says he began to drink excessively since 7 months ago. Cites cultural normality of drinking after work and liking the taste of beer. Last drink was 6 days ago. Had two 12 oz beers. Sometimes drinks 6-7 12 oz beers. No liquors. Stopped drinking six days ago because of the generalized pruritus. He is worried that he may have a disease of some sort. Plans to quit drinking and is thinking about going into a rehab program. Has never had DTs. Does not endorse hematemesis, hematochezia, abdominal pain, CP, palpitations, SOB, HA, tingling, numbness, rash, swelling, diarrhea, or GU sxs. Denies use of any illegal drugs.     Outpatient Medications Prior to Visit  Medication Sig Dispense Refill  . omeprazole (PRILOSEC) 20 MG capsule Take 1 tablet by mouth before breakfast and 1 tablet before dinner (Patient not taking: Reported on 06/09/2018) 60 capsule 3  . sertraline (ZOLOFT) 25 MG tablet Take 1 tablet (25 mg total) by mouth daily. (Patient not taking: Reported on 08/04/2018) 30 tablet 2  . naproxen (NAPROSYN) 500 MG tablet Take 1 tablet (500 mg total) by mouth 2 (two) times daily with a meal. 30 tablet 0   Facility-Administered Medications Prior to Visit  Medication Dose Route Frequency Provider Last Rate Last Dose  . 0.9 %  sodium chloride infusion  500 mL Intravenous Continuous Pyrtle, Carie Caddy, MD         ROS Review of Systems  Constitutional: Negative for chills, fever and malaise/fatigue.  Eyes:  Negative for blurred vision.  Respiratory: Negative for shortness of breath.   Cardiovascular: Negative for chest pain and palpitations.  Gastrointestinal: Negative for abdominal pain and nausea.  Genitourinary: Negative for dysuria and hematuria.  Musculoskeletal: Negative for joint pain and myalgias.  Skin: Positive for itching. Negative for rash.  Neurological: Negative for tingling and headaches.  Psychiatric/Behavioral: Negative for depression. The patient is nervous/anxious.     Objective:  BP 112/75 (BP Location: Left Arm, Patient Position: Sitting, Cuff Size: Normal)   Pulse 68   Temp 97.8 F (36.6 C) (Oral)   Ht 5\' 9"  (1.753 m)   Wt 214 lb (97.1 kg)   SpO2 100%   BMI 31.60 kg/m   BP/Weight 08/04/2018 06/09/2018 03/24/2018  Systolic BP 112 111 96  Diastolic BP 75 69 61  Wt. (Lbs) 214 213 210  BMI 31.6 31.45 30.13      Physical Exam  Constitutional: He is oriented to person, place, and time.  Well developed, well nourished, NAD, polite  HENT:  Head: Normocephalic and atraumatic.  Eyes: No scleral icterus.  Neck: Normal range of motion. Neck supple. No thyromegaly present.  Cardiovascular: Normal rate, regular rhythm and normal heart sounds.  Pulmonary/Chest: Effort normal and breath sounds normal.  Abdominal: Soft. Bowel sounds are normal. There is no tenderness.  Musculoskeletal: He exhibits no edema.  Neurological: He is alert and oriented to person, place, and time.  Skin: Skin is warm and dry. No rash noted. No erythema. No pallor.  Psychiatric:  His behavior is normal. Thought content normal.  Anxious. Poor insight, poor judgement.  Vitals reviewed.    Assessment & Plan:    1. Generalized pruritus - No signs of lesions, rash, excoriations, illness today. - Comprehensive metabolic panel - Begin hydrOXYzine (ATARAX/VISTARIL) 25 MG tablet; Take 1 tablet (25 mg total) by mouth at bedtime.  Dispense: 30 tablet; Refill: 0  2. Alcohol abuse - Comprehensive  metabolic panel - Begin naltrexone (DEPADE) 50 MG tablet; Take 1 tablet (50 mg total) by mouth daily.  Dispense: 30 tablet; Refill: 0 - Drug Screen, Urine - Pt will be referred to alcohol rehabilitation at [facility] if he fails on Naltrexone.   3. Need for prophylactic vaccination and inoculation against influenza - Flu Vaccine QUAD 6+ mos PF IM (Fluarix Quad PF)  4. High risk medication use - Drug Screen, Urine   Meds ordered this encounter  Medications  . omeprazole (PRILOSEC) 20 MG capsule    Sig: Take 1 tablet by mouth before breakfast and 1 tablet before dinner    Dispense:  60 capsule    Refill:  3    Name brand only, no generic    Order Specific Question:   Supervising Provider    Answer:   Hoy Register [[id number]]  . hydrOXYzine (ATARAX/VISTARIL) 25 MG tablet    Sig: Take 1 tablet (25 mg total) by mouth at bedtime.    Dispense:  30 tablet    Refill:  0    Order Specific Question:   Supervising Provider    Answer:   Hoy Register [[id number]]  . naltrexone (DEPADE) 50 MG tablet    Sig: Take 1 tablet (50 mg total) by mouth daily.    Dispense:  30 tablet    Refill:  0    Order Specific Question:   Supervising Provider    Answer:   Hoy Register [[id number]]    Follow-up: Return in about 4 weeks (around 09/01/2018) for alcohol abuse.   Loletta Specter PA

## 2018-08-04 NOTE — Patient Instructions (Signed)
Community Resources  Advocacy/Legal Legal Aid [place]:  1-866-219-5262  /  336-272-0148  Family Justice Center:  336-641-7233  [facility] [place] 24-hr Crisis line:  336-273-7273  Women's Resource Center, [place]:  336-275-6090  Court Watch (custody):  336-275-2346  [place] [facility]:   336-279-9299    Baby & Breastfeeding Car Seat Inspection @ Various [place] Fire Depts.- call 336-373-2177  [facility]  336-832-6860  [facility] 336-878-6712  WIC: 336-641-3663 ([place]);  336-641-7571 ([place])  La Leche League:  1-877-452-5321   Childcare [place] [facility]: 336-369-5097 ([place]) / 336-887-8224 ([place])  - Child Care Resources/ Referrals/ Scholarships  - Head Start/ Early Head Start (call or apply online)  [place] DHHS: [place] :  1-800-859-0829 / 336-274-5437   Employment / Job Search [facility] [place]: 336-275-6090 / [address]  [place] [employer] (JobLink): 336-373-5922 ([place]) / 336-882-4141 ([place])  [facility] Community Resource/ Career Center: 336-275-9801 / 336-282-7307  [facility] Job & Career Center: 336-373-3764  DHHS Work First: 336-641-3447 ([place]) / 336-641-3447 ([place])  [facility] [place]:  336-676-5871   Financial Assistance [facility]:  336-553-2657  [employer]: 336-235-0368  [employer] (furniture):  336-370-4002  [place] [facility]: 336-373-4264  Low Income Energy Assistance  336-641-3000   Food Assistance DHHS- SNAP/ Food Stamps: 336-641-4588  WIC: [place]- 336-641-3663 ;  [place] 336-641-7571  [place] Book- Free Meals  Little Blue Book- Free Food Pantries  During the summer, text "FOOD" to 877877   General Health / Clinics (Adults) Orange Card (for Adults) through [facility]: (336) 895-4900  [facility]:   336-832-8035  [facility]:   336-832-4444  Health Department:  336-641-3245  [facility]:  336-415-3877 / 336-641-2100  Planned [facility] of [place]:   336-373-0678  [facility]:   336-334-4822 x [address]   Housing [place] Housing Coalition:   336-691-9521  [place] Housing Authority:  336-275-8501  Affordable Housing Managemnt:  336-273-0568   Immigrant/ Refugee Center for [place]ians (UNCG):  336-256-1065  Faith [employer] [facility]:  336-379-0037  [employer] [facility]:  336-937-4701  [employer] World Services:  336-617-0381  African Services Coalition:  336-574-2677   LGBTQ YouthSAFE  www.youthsafe[place].org  PFLAG  336-541-6754 / info@[place].org  The [place] Project:  1-866-488-7386   Mental Health/ Substance Use [facility]  336-387-6161  [facility]:  336-832-9700 or 1-800-711-2635  [facility] of Care:  336-271-5888  [facility]:  336-294-1349  [facility]:  336-542-2884  [facility] (walk-ins)  336-676-6840 / [address]  [place]:  800-449-1287  Alcoholics Anonymous:  336-854-4278  Narcotics Anonymous:  800-365-1036  Quit Smoking Hotline:  800-QUIT-NOW (800-784-8669)   Parenting Children's Home Society:  800-632-1400  [place]: Education Center & Support Groups:  336-832-6682  YWCA: 336-273-3461  [employer]CG: Bringing Out the Best:  336-334-3120               Thriving at Three (Hispanic families): 336-256-1066  Healthy Start ([facility]):  336-387-6161 x2288  Parents as Teachers:  336-691-0024  [place] [facility]- Learning Together (Immigrants): 336-369-5001   Poison Control 800-222-1222  Sports & Recreation [employer] Open Doors Application: ymcanwnc.org/join/open-doors-financial-assistance/  City of [place] Recreation Centers: http://www.[place].gov/index.aspx?page=3615   Special Needs Family Support Network:  336-832-6507  Autism Society of [place]:   336-333-0197 x1402 or x1412 /  800-785-1035  TEACCH [place]:  336-334-5773     ARC of [place]:  336-373-1076  Children's Developmental Service Agency (CDSA):  336-334-5601  CC4C (Care Coordination for Children):  336-641-7641   Transportation Medicaid Transportation: 336-641-4848 to apply  [place] Transit Authority: 336-335-6499 (reduced-fare bus ID to Medicaid/ Medicare/ Orange Card)  SCAT Paratransit services: Eligible riders only, call 336-333-6589 for application   Tutoring/Mentoring [facility] Child Development Institute: 336-230-2138  Big Brothers/ Big Sisters: 336-378-9100 ([place])  336-882-4167 ([place])  ACES through child's school: 336-370-2321  [employer] Achievers: contact your local Y  SHIELD Mentor Program: 336-337-2771   

## 2018-08-05 LAB — COMPREHENSIVE METABOLIC PANEL
A/G RATIO: 1.8 (ref 1.2–2.2)
ALBUMIN: 4.7 g/dL (ref 3.5–5.5)
ALK PHOS: 58 IU/L (ref 39–117)
ALT: 17 IU/L (ref 0–44)
AST: 16 IU/L (ref 0–40)
BILIRUBIN TOTAL: 0.2 mg/dL (ref 0.0–1.2)
BUN / CREAT RATIO: 20 (ref 9–20)
BUN: 18 mg/dL (ref 6–20)
CO2: 23 mmol/L (ref 20–29)
Calcium: 9.6 mg/dL (ref 8.7–10.2)
Chloride: 101 mmol/L (ref 96–106)
Creatinine, Ser: 0.88 mg/dL (ref 0.76–1.27)
GFR calc non Af Amer: 111 mL/min/{1.73_m2} (ref >59)
GFR, EST AFRICAN AMERICAN: 128 mL/min/{1.73_m2} (ref >59)
Globulin, Total: 2.6 g/dL (ref 1.5–4.5)
Glucose: 107 mg/dL — ABNORMAL HIGH (ref 65–99)
POTASSIUM: 5 mmol/L (ref 3.5–5.2)
SODIUM: 141 mmol/L (ref 134–144)
TOTAL PROTEIN: 7.3 g/dL (ref 6.0–8.5)

## 2018-08-07 ENCOUNTER — Telehealth (INDEPENDENT_AMBULATORY_CARE_PROVIDER_SITE_OTHER): Payer: Self-pay

## 2018-08-07 NOTE — Telephone Encounter (Signed)
Call placed using pacific interpreter [phone]) patient is aware that liver does not have any signs of inflammation. No questions or additional concerns. Maryjean Morn, CMA

## 2018-08-07 NOTE — Telephone Encounter (Signed)
-----   Message from Loletta Specter, PA-C sent at 08/05/2018 11:54 AM EDT ----- Liver without sign of inflammation.

## 2018-09-01 ENCOUNTER — Other Ambulatory Visit: Payer: Self-pay

## 2018-09-01 ENCOUNTER — Encounter (INDEPENDENT_AMBULATORY_CARE_PROVIDER_SITE_OTHER): Payer: Self-pay | Admitting: Physician Assistant

## 2018-09-01 ENCOUNTER — Ambulatory Visit (INDEPENDENT_AMBULATORY_CARE_PROVIDER_SITE_OTHER): Payer: Self-pay | Admitting: Physician Assistant

## 2018-09-01 VITALS — BP 113/75 | HR 62 | Temp 98.1°F | Ht 69.0 in | Wt 210.8 lb

## 2018-09-01 DIAGNOSIS — L299 Pruritus, unspecified: Secondary | ICD-10-CM

## 2018-09-01 DIAGNOSIS — F1010 Alcohol abuse, uncomplicated: Secondary | ICD-10-CM

## 2018-09-01 DIAGNOSIS — F411 Generalized anxiety disorder: Secondary | ICD-10-CM

## 2018-09-01 DIAGNOSIS — Z9119 Patient's noncompliance with other medical treatment and regimen: Secondary | ICD-10-CM

## 2018-09-01 DIAGNOSIS — K602 Anal fissure, unspecified: Secondary | ICD-10-CM

## 2018-09-01 DIAGNOSIS — Z91199 Patient's noncompliance with other medical treatment and regimen due to unspecified reason: Secondary | ICD-10-CM

## 2018-09-01 DIAGNOSIS — K6289 Other specified diseases of anus and rectum: Secondary | ICD-10-CM

## 2018-09-01 MED ORDER — HYDROXYZINE HCL 25 MG PO TABS: 25.0000 mg | ORAL_TABLET | Freq: Every day | ORAL | 1 refills | Status: DC

## 2018-09-01 MED ORDER — SERTRALINE HCL 25 MG PO TABS: 25.0000 mg | ORAL_TABLET | Freq: Every day | ORAL | 2 refills | Status: DC

## 2018-09-01 NOTE — Progress Notes (Signed)
Subjective:  Patient ID: Frank Wilcox, male    DOB: 26-Apr-1981  Age: 37 y.o. MRN: [id number]  CC: f/u anxiety and alcohol abuse  HPI Frank Wilcox a 37 y.o.malewith a medical history of alcohol abuse, anxiety, gastritis, PUD, sleep disturbance, and acid reflux presents to follow up on anxiety and alcohol abuse. Had been drinking up to 6 -7 12 oz beers per day. Stopped drinking alcohol due to worry about lab abnormalities and due to symptoms of pruritus and abdominal pain. Did not take naltrexone as directed as he felt it was not necessary. No symptoms of DTs. Thinks his anxiety was somewhat relieved after he heard of normal results and felt symptoms resolved. However, he recognizes that he is still highly stressed and there may still be persistent anxiety. Has not taken sertraline as directed. Worries in excess about normal day to day activities and the high demands of family life to include financial and social support. Feels like he is always under pressure to perform. 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    Complains of occasional sharp anal pain. Had a previous diagnosis of anal fissure which seemed to have resolved on its own but has since returned. Would like to know what disease he may have if any that is causing his anal discomfort. Does not endorse constipation, long hours of sitting/driving, or hematochezia.      Outpatient Medications Prior to Visit  Medication Sig Dispense Refill  . hydrOXYzine (ATARAX/VISTARIL) 25 MG tablet Take 1 tablet (25 mg total) by mouth at bedtime. (Patient not taking: Reported on 09/01/2018) 30 tablet 0  . naltrexone (DEPADE) 50 MG tablet Take 1 tablet (50 mg total) by mouth daily. (Patient not taking: Reported on 09/01/2018) 30 tablet 0  . omeprazole (PRILOSEC) 20 MG capsule Take 1 tablet by mouth before breakfast and 1 tablet before dinner (Patient not taking: Reported on 09/01/2018) 60 capsule 3  . sertraline  (ZOLOFT) 25 MG tablet Take 1 tablet (25 mg total) by mouth daily. (Patient not taking: Reported on 08/04/2018) 30 tablet 2   Facility-Administered Medications Prior to Visit  Medication Dose Route Frequency Provider Last Rate Last Dose  . 0.9 %  sodium chloride infusion  500 mL Intravenous Continuous Pyrtle, Carie Caddy, MD         ROS Review of Systems  Constitutional: Negative for chills, fever and malaise/fatigue.  Eyes: Negative for blurred vision.  Respiratory: Negative for shortness of breath.   Cardiovascular: Negative for chest pain and palpitations.  Gastrointestinal: Negative for abdominal pain and nausea.  Genitourinary: Negative for dysuria and hematuria.  Musculoskeletal: Negative for joint pain and myalgias.  Skin: Negative for rash.  Neurological: Negative for tingling and headaches.  Psychiatric/Behavioral: Negative for depression. The patient is nervous/anxious.     Objective:  BP 113/75 (BP Location: Left Arm, Patient Position: Sitting, Cuff Size: Normal)   Pulse 62   Temp 98.1 F (36.7 C) (Oral)   Ht 5\' 9"  (1.753 m)   Wt 210 lb 12.8 oz (95.6 kg)   SpO2 98%   BMI 31.13 kg/m   BP/Weight 09/01/2018 08/04/2018 06/09/2018  Systolic BP 113 112 111  Diastolic BP 75 75 69  Wt. (Lbs) 210.8 214 213  BMI 31.13 31.6 31.45      Physical Exam  Constitutional: He is oriented to person, place, and time.  Well developed, well nourished, NAD, polite  HENT:  Head: Normocephalic and atraumatic.  Eyes: No scleral icterus.  Neck: Normal range  of motion. Neck supple. No thyromegaly present.  Cardiovascular: Normal rate, regular rhythm and normal heart sounds.  Pulmonary/Chest: Effort normal and breath sounds normal.  Abdominal: Soft. Bowel sounds are normal. There is no tenderness.  Musculoskeletal: He exhibits no edema.  Neurological: He is alert and oriented to person, place, and time.  Skin: Skin is warm and dry. No rash noted. No erythema. No pallor.  Psychiatric: He  has a normal mood and affect. His behavior is normal. Thought content normal.  Vitals reviewed.    Assessment & Plan:   1. Alcohol abuse - Resolved. Pt's anxiety caused much concern about alcohol induced complications. Did not take naltrexone as directed as he felt it was not necessary. No symptoms of DTs.  2. Generalized pruritus - Begin hydrOXYzine (ATARAX/VISTARIL) 25 MG tablet; Take 1 tablet (25 mg total) by mouth at bedtime.  Dispense: 30 tablet; Refill: 1  3. Generalized anxiety disorder - Begin sertraline (ZOLOFT) 25 MG tablet; Take 1 tablet (25 mg total) by mouth daily.  Dispense: 30 tablet; Refill: 2  4. Anal fissure - Ambulatory referral to Gastroenterology. Evaluate for IBD.  5. Non-compliance - Pt was counseled on the importance of taking medications as directed.    Meds ordered this encounter  Medications  . sertraline (ZOLOFT) 25 MG tablet    Sig: Take 1 tablet (25 mg total) by mouth daily.    Dispense:  30 tablet    Refill:  2    Order Specific Question:   Supervising Provider    Answer:   Hoy RegisterNEWLIN, ENOBONG [[id number]]  . hydrOXYzine (ATARAX/VISTARIL) 25 MG tablet    Sig: Take 1 tablet (25 mg total) by mouth at bedtime.    Dispense:  30 tablet    Refill:  1    Order Specific Question:   Supervising Provider    Answer:   Hoy RegisterNEWLIN, ENOBONG [[id number]]    Follow-up: Return in about 3 months (around 12/02/2018) for anxiety.   Loletta Specteroger David Gomez PA

## 2018-09-01 NOTE — Patient Instructions (Signed)
Vivir con ansiedad  Living With Anxiety  Despus de haber sido diagnosticado con trastorno de ansiedad, podra sentirse aliviado por comprender por qu se haba sentido o haba actuado de cierto modo. Adems, es natural sentirse abrumado por el tratamiento que tiene por delante y por lo que este significar para su vida. Con atencin y ayuda, puede manejar este trastorno y recuperarse.  Cmo hacer frente a la ansiedad  Enfrentar el estrs  El estrs es la reaccin del cuerpo ante los cambios y los acontecimientos de la vida, tanto buenos como malos. El estrs puede durar solo algunas horas o puede ser permanente. El estrs puede influir mucho en la ansiedad, por lo que es importante aprender sobre cmo hacerle frente y cmo pensarlo de un modo nuevo.  Hable con el mdico o un orientador psicolgico para obtener ms informacin sobre cmo reducir el estrs. Podran sugerirle algunas tcnicas para hacerlo, como:   Musicoterapia. Esto podra incluir crear o escuchar msica que disfrute y lo inspire.   Meditacin consciente. Esto implica prestar atencin a la respiracin normal ms que intentar controlarla. Puede realizarse mientras est sentado o camina.   Oracin centrante. Este es un tipo de meditacin que implica centrarse en una palabra, frase o imagen sagrada que le sea representativa y le genere paz.   Respiracin profunda. Para hacer esto, expanda el estmago e inhale lentamente por la nariz. Mantenga el aire durante un lapso de entre 3y5segundos. Luego, exhale lentamente mientras deja que los msculos del estmago se relajen.   Dilogo interno. Se trata de una habilidad por la que usted es capaz de identificar patrones de pensamiento que lo llevan a tener reacciones ansiosas y de corregir dichos pensamientos.   Relajacin muscular. Esto implica tensar los msculos y, luego, relajarlos.    Elija una tcnica para reducir el estrs que se adapte a su estilo de vida y su personalidad. Las tcnicas para  reducir el estrs llevan tiempo y prctica. Resrvese de 5a15minutos por da para hacerlas. Algunos terapeutas pueden ofrecerle capacitacin para aprenderlas. Es posible que algunos planes de seguro mdico cubran la capacitacin. Otras cosas que puede hacer para manejar el estrs:   Llevar un registro del estrs. Esto puede ayudarlo a identificar qu le desencadena el estrs y modos de controlar su reaccin.   Pensar en cmo reacciona ante ciertas situaciones. Es posible que no sea capaz de controlar todo, pero puede controlar su reaccin.   Hacerse tiempo para las actividades que lo ayudan a relajarse y no sentir culpa por pasar su tiempo de este modo.    La terapia en combinacin con las habilidades para enfrentar y reducir el estrs proporciona la mejor alternativa para un tratamiento satisfactorio.  Medicamentos  Los medicamentos pueden ayudar a aliviar los sntomas. Algunos medicamentos para la ansiedad:   Medicamentos contra la ansiedad.   Antidepresivos.   Betabloqueantes.    Es posible que se requieran medicamentos, junto con la terapia, si otros tratamientos no dieron resultados. Un mdico debe recetar los medicamentos.  Las relaciones  Las relaciones interpersonales pueden ser muy importantes para ayudar a su recuperacin. Intente pasar ms tiempo interactuando con amigos y familiares de confianza. Considere la posibilidad de ir a terapia de pareja, tomar clases de educacin familiar o ir a terapia familiar. La terapia puede ayudarlos a usted y a los dems a comprender mejor el trastorno.  Cmo reconocer cambios en el trastorno  Todos tienen una respuesta diferente al tratamiento de la ansiedad. Se dice que est recuperado   de la ansiedad cuando los sntomas disminuyen y dejan de interferir en las actividades diarias en el hogar o el trabajo. Esto podra significar que usted comenzar a hacer lo siguiente:   Tener mejor concentracin y atencin.   Dormir mejor.   Estar menos irritable.   Tener  ms energa.   Tener mejor memoria.    Es importante reconocer cundo el trastorno empeora. Comunquese con el mdico si sus sntomas interfieren en su hogar o su trabajo, y usted no siente que el trastorno est mejorando.  Dnde encontrar ayuda y apoyo:  Puede conseguir ayuda y apoyo en los siguientes lugares:   Grupos de autoayuda.   Organizaciones comunitarias y en lnea.   Un lder espiritual de confianza.   Terapia de pareja.   Clases de educacin familiar.   Terapia familiar.    Siga estas instrucciones en su casa:   Consuma una dieta saludable que incluya abundantes frutas, verduras, cereales integrales, productos lcteos descremados y protenas magras. No consuma muchos alimentos con alto contenido de grasas slidas, azcares agregados o sal.   Actividad fsica. La mayora de los adultos debe hacer lo siguiente:  ? Realizar, al menos, 150minutos de actividad fsica por semana. El ejercicio debe aumentar la frecuencia cardaca y hacerlo transpirar (ejercicio de intensidad moderada).  ? Realizar ejercicios de fortalecimiento por lo menos dos veces por semana.   Disminuir el consumo de cafena, tabaco, alcohol y otras sustancias potencialmente dainas.   Dormir el tiempo adecuado y de la forma correcta. La mayora de los adultos necesitan entre 7y9horas de sueo todas las noches.   Opte por cosas que le simplifiquen la vida.   Tome los medicamentos de venta libre y los recetados solamente como se lo haya indicado el mdico.   Evite el consumo de cafena, alcohol y ciertos medicamentos contra el resfro de venta sin receta. Estos podran hacerlo sentir peor. Pregntele al farmacutico qu medicamentos no debera tomar.   Concurra a todas las visitas de control como se lo haya indicado el mdico. Esto es importante.  Preguntas para hacerle al mdico   Me ser til la terapia?   Con qu frecuencia debo visitar a un mdico para el seguimiento?   Durante cunto tiempo tendr que tomar los  medicamentos?   Tienen efectos secundarios a largo plazo los medicamentos que tomo?   Existe una alternativa que remplace los medicamentos?  Comunquese con un mdico si:   Le resulta difcil permanecer concentrado o finalizar las tareas diarias.   Pasa muchas horas por da sintindose preocupado por la vida cotidiana.   La preocupacin le provoca un cansancio extremo.   Comienza a tener dolores de cabeza o nuseas, o a sentirse tenso.   Orina ms de lo normal.   Tiene diarrea.  Solicite ayuda de inmediato si:   Se le acelera la frecuencia cardaca y le cuesta respirar.   Tiene pensamientos acerca de lastimarse o daar a otras personas.  Si alguna vez siente que puede lastimarse o lastimar a los dems, o tiene pensamientos de poner fin a su vida, busque ayuda de inmediato. Puede dirigirse al servicio de urgencias ms cercano o comunicarse con:   El servicio de emergencias de su localidad (911 en los [place]).   Una lnea de asistencia al suicida y atencin en crisis, como la Lnea Nacional de Prevencin del Suicidio (National Suicide Prevention Lifeline) al 1-800-273-8255. Est disponible las 24 horas del da.    Resumen   Tomar medidas para enfrentar el estrs   puede calmarlo.   Los medicamentos no pueden curar los trastornos de ansiedad, pero pueden ayudar a aliviar los sntomas.   Los familiares, los amigos y las parejas pueden tener un lugar importante en su recuperacin del trastorno de ansiedad.  Esta informacin no tiene como fin reemplazar el consejo del mdico. Asegrese de hacerle al mdico cualquier pregunta que tenga.  Document Released: 01/08/2017 Document Revised: 01/08/2017 Document Reviewed: 01/08/2017  Elsevier Interactive Patient Education  2018 Elsevier Inc.

## 2018-10-07 ENCOUNTER — Encounter: Payer: Self-pay | Admitting: Physician Assistant

## 2018-12-01 ENCOUNTER — Ambulatory Visit (INDEPENDENT_AMBULATORY_CARE_PROVIDER_SITE_OTHER): Payer: Self-pay | Admitting: Primary Care

## 2019-12-29 ENCOUNTER — Ambulatory Visit: Payer: Self-pay

## 2020-08-24 ENCOUNTER — Ambulatory Visit: Payer: Self-pay | Admitting: Internal Medicine

## 2020-08-24 ENCOUNTER — Encounter: Payer: Self-pay | Admitting: Internal Medicine

## 2020-08-24 VITALS — BP 116/68 | HR 64 | Resp 16 | Ht 68.0 in | Wt 195.5 lb

## 2020-08-24 DIAGNOSIS — Z13220 Encounter for screening for lipoid disorders: Secondary | ICD-10-CM

## 2020-08-24 DIAGNOSIS — F1010 Alcohol abuse, uncomplicated: Secondary | ICD-10-CM

## 2020-08-24 DIAGNOSIS — F411 Generalized anxiety disorder: Secondary | ICD-10-CM | POA: Insufficient documentation

## 2020-08-24 DIAGNOSIS — R1012 Left upper quadrant pain: Secondary | ICD-10-CM

## 2020-08-24 MED ORDER — SERTRALINE HCL 50 MG PO TABS: ORAL_TABLET | ORAL | 6 refills | Status: DC

## 2020-08-24 NOTE — Progress Notes (Signed)
Social work Theatre manager met with new patient who is scheduled with Dr. Amil Amen for medical visit. Social worker completed New Patient Questionnaire which included completion of housing, intimate partner violence, transportation needs, stress, Emergency planning/management officer strain, food insecurity and screeners. Social History   Socioeconomic History  . Marital status: Married    Spouse name: Dawson Bills  . Number of children: 1  . Years of education: Not on file  . Highest education level: Not on file  Occupational History  . Occupation: Agricultural consultant: OTHER  Tobacco Use  . Smoking status: Former Smoker    Packs/day: 0.10    Types: Cigarettes    Start date: 06/10/1996    Quit date: 06/23/2015    Years since quitting: 5.1  . Smokeless tobacco: Never Used  Substance and Sexual Activity  . Alcohol use: Yes    Alcohol/week: 0.0 standard drinks    Comment: was drinking 80 oz of malt liquor until stomach pain caused him to stop 06/2015  . Drug use: No  . Sexual activity: Yes    Partners: Female    Comment: occ cigarette-not daily  Other Topics Concern  . Not on file  Social History Narrative   Originally from [place]   Moved to [place]. In 2004.   Lives at home with wife and 2 children   Understands English, but much more comfortable with Spanish   Social Determinants of Health   Financial Resource Strain: Medium Risk  . Difficulty of Paying Living Expenses: Somewhat hard  Food Insecurity: Food Insecurity Present  . Worried About Charity fundraiser in the Last Year: Sometimes true  . Ran Out of Food in the Last Year: Sometimes true  Transportation Needs: No Transportation Needs  . Lack of Transportation (Medical): No  . Lack of Transportation (Non-Medical): No  Physical Activity:   . Days of Exercise per Week: Not on file  . Minutes of Exercise per Session: Not on file  Stress: No Stress Concern Present  . Feeling of Stress : Only a little  Social Connections: Socially Integrated  .  Frequency of Communication with Friends and Family: More than three times a week  . Frequency of Social Gatherings with Friends and Family: More than three times a week  . Attends Religious Services: More than 4 times per year  . Active Member of Clubs or Organizations: Yes  . Attends Archivist Meetings: 1 to 4 times per year  . Marital Status: Married    Depression screen [facility] 2/9 08/24/2020 09/01/2018 08/04/2018 06/09/2018 03/24/2018  Decreased Interest 1 0 '1 1 2  ' Down, Depressed, Hopeless 1 0 0 (No Data) 1  PHQ - 2 Score 2 0 '1 1 3  ' Altered sleeping 3 - - - 1  Tired, decreased energy 0 - - - 1  Change in appetite 0 - - - 1  Feeling bad or failure about yourself  1 - - - 0  Trouble concentrating 1 - - - 0  Moving slowly or fidgety/restless 0 - - - 0  Suicidal thoughts 0 - - - 0  PHQ-9 Score 7 - - - 6  Difficult doing work/chores Somewhat difficult - - - -    GAD 7 : Generalized Anxiety Score 08/24/2020 09/01/2018 08/04/2018 06/09/2018  Nervous, Anxious, on Edge 0 0 1 (No Data)  Control/stop worrying 1 0 1 (No Data)  Worry too much - different things 1 0 0 0  Trouble relaxing 0 1 0 0  Restless  0 0 0 (No Data)  Easily annoyed or irritable 3 0 0 0  Afraid - awful might happen 0 0 1 (No Data)  Total GAD 7 Score '5 1 3 ' -  Anxiety Difficulty Somewhat difficult - - -   Social Work Theatre manager introduced themselves to client and stated they were there to ask some questions. Client stated that he was experiencing some stress in regards to not always being given jobs and because of that he is worried about finances occasionally. Client also states that he drinks a lot of alcohol and he often feels guilty about it. The last time he drank he had 10 beverages. He states that his wife does not like his drinking and wants him to stop. Client mentions that he wants to stop and he goes long periods without drinking before relapsing. Social Work Intern asked the client if he would be interested in  counseling, and the client declined counseling services. Social Work Theatre manager gave client contact information card if he decides he would like counseling in the future.   Based on presentation Recommended counseling.  Elenore Rota, MSW Intern

## 2020-08-24 NOTE — Progress Notes (Signed)
Subjective:    Patient ID: Frank Wilcox, male   DOB: 1981-08-06, 39 y.o.   MRN: [id number]   HPI   Re-establishing--not seen since 2017  Interpreted by Arlana Lindau  1.  Wants to be checked to make sure everything is okay.  Worried a couple of weeks ago as had not had anything alcoholic to drink for a while and then drank again on September 10th and developed abdominal pain and generalized pruritis.    2.  Alcohol Abuse:  Was treated with Naltrexone in 2019 with Sindy Messing, PA, but does not sound like he really took as recommended.  Ultimately placed on Sertraline after he quit for a time.   Never did go to outpatient treatment.   Sounds like he felt the Sertraline helped also, but he ran out.    3.  Depression/Anxiety:  Again, low dose Sertraline helped him previously. He is very anxious about his health and would like to have labs done today and not fasting as his concerns are making him more anxious   No outpatient medications have been marked as taking for the 08/24/20 encounter (Office Visit) with Julieanne Manson, MD.   No Known Allergies   Past Medical History:  Diagnosis Date  . Alcohol abuse   . Anxiety   . Esophageal reflux   . H. pylori infection   . S/P laparoscopic appendectomy 06/25/2012  . Sleep disturbance, unspecified   . Unspecified gastritis and gastroduodenitis without mention of hemorrhage     Past Surgical History:  Procedure Laterality Date  . ANKLE SURGERY Right 2007   ORIF right ankle fractures--work related injury  . LAPAROSCOPIC APPENDECTOMY  06/06/2012   Procedure: APPENDECTOMY LAPAROSCOPIC;  Surgeon: Liz Malady, MD;  Location: [facility] OR;  Service: General;  Laterality: N/A;    Family History  Problem Relation Age of Onset  . Diabetes Mother   . Anxiety disorder Mother   . Hyperlipidemia Father   . Hypertension Father   . Gout Father     Social History   Socioeconomic History  . Marital status: Married    Spouse  name: Myrlene Broker  . Number of children: 1  . Years of education: Not on file  . Highest education level: Not on file  Occupational History  . Occupation: Therapist, sports: OTHER  Tobacco Use  . Smoking status: Former Smoker    Packs/day: 0.10    Types: Cigarettes    Start date: 06/10/1996    Quit date: 06/23/2015    Years since quitting: 5.7  . Smokeless tobacco: Never Used  Substance and Sexual Activity  . Alcohol use: Yes    Alcohol/week: 0.0 standard drinks    Comment: was drinking 80 oz of malt liquor until stomach pain caused him to stop 06/2015  . Drug use: No  . Sexual activity: Yes    Partners: Female    Comment: occ cigarette-not daily  Other Topics Concern  . Not on file  Social History Narrative   Originally from [place]   Moved to [place]. In 2004.   Lives at home with wife and 2 children   Understands English, but much more comfortable with Spanish   Social Determinants of Health   Financial Resource Strain: Medium Risk  . Difficulty of Paying Living Expenses: Somewhat hard  Food Insecurity: Food Insecurity Present  . Worried About Programme researcher, broadcasting/film/video in the Last Year: Sometimes true  . Ran Out of Food in the Last Year: Sometimes  true  Transportation Needs: No Transportation Needs  . Lack of Transportation (Medical): No  . Lack of Transportation (Non-Medical): No  Physical Activity: Not on file  Stress: No Stress Concern Present  . Feeling of Stress : Only a little  Social Connections: Socially Integrated  . Frequency of Communication with Friends and Family: More than three times a week  . Frequency of Social Gatherings with Friends and Family: More than three times a week  . Attends Religious Services: More than 4 times per year  . Active Member of Clubs or Organizations: Yes  . Attends Banker Meetings: 1 to 4 times per year  . Marital Status: Married  Catering manager Violence: Not At Risk  . Fear of Current or Ex-Partner: No  .  Emotionally Abused: No  . Physically Abused: No  . Sexually Abused: No      Review of Systems    Objective:   BP 116/68 (BP Location: Right Arm, Patient Position: Sitting, Cuff Size: Normal)   Pulse 64   Resp 16   Ht 5\' 8"  (1.727 m)   Wt 195 lb 8 oz (88.7 kg)   BMI 29.73 kg/m   Physical Exam  Anxious appearing HEENT:  PERRL, EOMI Neck:  Supple, No adenopathy, no thyromegaly Chest:  CTA CV:  RRR with normal S1 and S2, No S3, S4 or murmur.  Radial and DP pulses normal and equal Abd:  S, NT, No HSM or mass, + BS LE:  No edema.   Assessment & Plan  1.  Alcohol Abuse:  Warm hand off to , LCSW  2.  Anxiety/Depression:  Restart low dose Sertraline.  Follow up with [employer], LCSW and me in 3-4 months.  Hopefully, will follow up.  3.  Abdominal discomfort with alcohol use:  CBC, CMP.  4.  HM:  nonfasting lipid panel.

## 2020-08-25 LAB — CBC WITH DIFFERENTIAL/PLATELET
Basophils Absolute: 0.1 10*3/uL (ref 0.0–0.2)
Basos: 1 %
EOS (ABSOLUTE): 0 10*3/uL (ref 0.0–0.4)
Eos: 1 %
Hematocrit: 45.8 % (ref 37.5–51.0)
Hemoglobin: 15.4 g/dL (ref 13.0–17.7)
Immature Grans (Abs): 0 10*3/uL (ref 0.0–0.1)
Immature Granulocytes: 0 %
Lymphocytes Absolute: 2.4 10*3/uL (ref 0.7–3.1)
Lymphs: 39 %
MCH: 31.4 pg (ref 26.6–33.0)
MCHC: 33.6 g/dL (ref 31.5–35.7)
MCV: 94 fL (ref 79–97)
Monocytes Absolute: 0.5 10*3/uL (ref 0.1–0.9)
Monocytes: 8 %
Neutrophils Absolute: 3.1 10*3/uL (ref 1.4–7.0)
Neutrophils: 51 %
Platelets: 210 10*3/uL (ref 150–450)
RBC: 4.9 x10E6/uL (ref 4.14–5.80)
RDW: 12.4 % (ref 11.6–15.4)
WBC: 6.1 10*3/uL (ref 3.4–10.8)

## 2020-08-25 LAB — LIPID PANEL W/O CHOL/HDL RATIO
Cholesterol, Total: 176 mg/dL (ref 100–199)
HDL: 56 mg/dL (ref >39)
LDL Chol Calc (NIH): 106 mg/dL — ABNORMAL HIGH (ref 0–99)
Triglycerides: 73 mg/dL (ref 0–149)
VLDL Cholesterol Cal: 14 mg/dL (ref 5–40)

## 2020-08-25 LAB — COMPREHENSIVE METABOLIC PANEL
ALT: 16 IU/L (ref 0–44)
AST: 17 IU/L (ref 0–40)
Albumin/Globulin Ratio: 1.8 (ref 1.2–2.2)
Albumin: 4.6 g/dL (ref 4.0–5.0)
Alkaline Phosphatase: 70 IU/L (ref 44–121)
BUN/Creatinine Ratio: 16 (ref 9–20)
BUN: 14 mg/dL (ref 6–20)
Bilirubin Total: 0.5 mg/dL (ref 0.0–1.2)
CO2: 28 mmol/L (ref 20–29)
Calcium: 9.7 mg/dL (ref 8.7–10.2)
Chloride: 101 mmol/L (ref 96–106)
Creatinine, Ser: 0.86 mg/dL (ref 0.76–1.27)
GFR calc Af Amer: 127 mL/min/{1.73_m2} (ref >59)
GFR calc non Af Amer: 110 mL/min/{1.73_m2} (ref >59)
Globulin, Total: 2.5 g/dL (ref 1.5–4.5)
Glucose: 109 mg/dL — ABNORMAL HIGH (ref 65–99)
Potassium: 5.1 mmol/L (ref 3.5–5.2)
Sodium: 140 mmol/L (ref 134–144)
Total Protein: 7.1 g/dL (ref 6.0–8.5)

## 2020-08-30 ENCOUNTER — Telehealth: Payer: Self-pay | Admitting: Clinical

## 2020-08-30 NOTE — Telephone Encounter (Signed)
LCSW contacted patient as a referral from PCP for counseling. Reports he is interested however at work currently unable to talk will call back later.  LCSW provided times he can call back.

## 2020-12-22 ENCOUNTER — Ambulatory Visit: Payer: Self-pay | Admitting: Internal Medicine

## 2021-03-22 ENCOUNTER — Encounter: Payer: Self-pay | Admitting: Internal Medicine

## 2021-03-22 ENCOUNTER — Other Ambulatory Visit: Payer: Self-pay

## 2021-03-22 ENCOUNTER — Ambulatory Visit: Payer: Self-pay | Admitting: Internal Medicine

## 2021-03-22 VITALS — BP 110/70 | HR 70 | Resp 12 | Ht 68.0 in | Wt 192.0 lb

## 2021-03-22 DIAGNOSIS — F1010 Alcohol abuse, uncomplicated: Secondary | ICD-10-CM

## 2021-03-22 DIAGNOSIS — F411 Generalized anxiety disorder: Secondary | ICD-10-CM

## 2021-03-22 DIAGNOSIS — L237 Allergic contact dermatitis due to plants, except food: Secondary | ICD-10-CM

## 2021-03-22 MED ORDER — PREDNISONE 10 MG PO TABS: ORAL_TABLET | ORAL | 0 refills | Status: DC

## 2021-03-22 MED ORDER — TRIAMCINOLONE ACETONIDE 0.1 % EX CREA: TOPICAL_CREAM | CUTANEOUS | 0 refills | Status: DC

## 2021-03-22 NOTE — Patient Instructions (Signed)
Prednisone 10 mg  Dia 1-4:  3 pastillas una vez al dia Dia 5:  2 1/2 pastillas una vez  Dia 6:  2 pastillas una vez Dia 7:  1 1/2 [place] Rast vez Dia 8:  1 pastilla una vez Dia 9:  1/2 pastilla una vez Dia 10:  Termina

## 2021-03-22 NOTE — Progress Notes (Signed)
     Subjective:    Patient ID: Frank Wilcox, male   DOB: [birth year], 40 y.o.   MRN: [id number]   HPI   Estefania Geanie Berlin interprets  Feels he has poison ivy. Four days ago, was gardening and later in the day noted itchy rash on arms and legs in exposed areas.  A bit on neck and abdomen.   Has applied rubbing alcohol, which he feels has helped.   Unknown cream he found at [facility] applied the first day with minimal improvement.   No outpatient medications have been marked as taking for the 03/22/21 encounter (Office Visit) with Julieanne Manson, MD.   No Known Allergies   Review of Systems    Objective:   BP 110/70 (BP Location: Left Arm, Patient Position: Sitting, Cuff Size: Normal)   Pulse 70   Resp 12   Ht 5\' 8"  (1.727 m)   Wt 192 lb (87.1 kg)   BMI 29.19 kg/m   Physical Exam  Significant involvement of legs and less so arms and abdomen with large patches and linear arrangements of fine vesicles on red patches.  Some are already drying and scabbed.   Assessment & Plan   1.  Poison Ivy:  Prednisone 30 mg for 4 days, then taper by 5 mg daily until off on Day #9.   Triamcinolone cream 0.1% twice daily to affected areas as needed. Cold water soaks. Went over photos of different forms of poison ivy so he can identify. Discussed washing off quickly if comes in contact as well as washing clothes.  2.  GAD/depression:  No longer taking Sertraline and does not feel he needs  3.  Alcoholism:  Has not had ETOH since seen in November.  Refused work with December, LCSW, which encouraged him to rethink to avoid recurrences.  He is not interested in connecting with her today as well.

## 2021-03-31 ENCOUNTER — Telehealth: Payer: Self-pay | Admitting: Internal Medicine

## 2021-03-31 MED ORDER — TRIAMCINOLONE ACETONIDE 0.1 % EX CREA: TOPICAL_CREAM | CUTANEOUS | 1 refills | Status: DC

## 2021-03-31 NOTE — Telephone Encounter (Signed)
Sending in refill of Triamcinolone

## 2021-05-19 ENCOUNTER — Ambulatory Visit: Payer: Self-pay | Admitting: Internal Medicine

## 2021-08-15 ENCOUNTER — Ambulatory Visit (HOSPITAL_COMMUNITY)
Admission: EM | Admit: 2021-08-15 | Discharge: 2021-08-15 | Disposition: A | Discharge status: Home / Self Care | Payer: Self-pay | Attending: Emergency Medicine | Admitting: Emergency Medicine

## 2021-08-15 ENCOUNTER — Other Ambulatory Visit: Payer: Self-pay

## 2021-08-15 ENCOUNTER — Encounter (HOSPITAL_COMMUNITY): Payer: Self-pay

## 2021-08-15 ENCOUNTER — Ambulatory Visit (INDEPENDENT_AMBULATORY_CARE_PROVIDER_SITE_OTHER): Payer: Self-pay

## 2021-08-15 DIAGNOSIS — Z77098 Contact with and (suspected) exposure to other hazardous, chiefly nonmedicinal, chemicals: Secondary | ICD-10-CM

## 2021-08-15 DIAGNOSIS — T148XXA Other injury of unspecified body region, initial encounter: Secondary | ICD-10-CM

## 2021-08-15 DIAGNOSIS — J689 Unspecified respiratory condition due to chemicals, gases, fumes and vapors: Secondary | ICD-10-CM

## 2021-08-15 NOTE — Discharge Instructions (Signed)
You can use ibuprofen if needed for your back and chest pain. Your chest x-ray is normal - no damage to your lungs. Make sure to follow product instructions when using chemicals - such as only using in open or well-ventilated areas.

## 2021-08-15 NOTE — ED Triage Notes (Signed)
Pt presents with back and chest pain X 1 week.  States he has been feeling nauseas.

## 2021-08-18 ENCOUNTER — Ambulatory Visit: Payer: Self-pay | Admitting: Internal Medicine

## 2021-08-18 NOTE — ED Provider Notes (Signed)
[facility]    CSN: [id number] Arrival date & time: 08/15/21  1527      History   Chief Complaint Chief Complaint  Patient presents with   Nausea   Back Pain    HPI Frank Wilcox is a 40 y.o. male.  2 weeks ago patient used some sort of wood sealant in an enclosed area that was not well ventilated.  He was wearing a mask but is worried that he may have lung damage from inhaling this chemical.  He shows me a picture he took of the product that and shows it should only be used in well ventilated areas or outside.  Initially after using the chemical, patient felt a little short of breath but this is completely resolved.  He denies shortness of breath or wheezing or cough at this time.  But he remains very worried he could have lung damage from this chemical.  He also reports right upper back pain and right anterior chest pain that started about a week ago.  This visit was conducted with the help of a video Spanish interpreter   Back Pain Associated symptoms: chest pain    Past Medical History:  Diagnosis Date   Alcohol abuse    Anxiety    Esophageal reflux    H. pylori infection    S/P laparoscopic appendectomy 06/25/2012   Sleep disturbance, unspecified    Unspecified gastritis and gastroduodenitis without mention of hemorrhage     Patient Active Problem List   Diagnosis Date Noted   Generalized anxiety disorder 08/24/2020   Gastritis 03/01/2016   GERD (gastroesophageal reflux disease) 05/21/2014   Abdominal pain when drinking alcohol 04/15/2014   Hip pain, bilateral 09/28/2013   Alcohol abuse 06/22/2013   Orthostasis 04/03/2013   Back pain 01/14/2013   Lateral epicondylitis  of elbow 07/24/2012   Healthcare maintenance 07/24/2012   Obesity (BMI 30-39.9) 07/24/2012   Dyspepsia 01/09/2012    Past Surgical History:  Procedure Laterality Date   ANKLE SURGERY Right 2007   ORIF right ankle fractures--work related injury   LAPAROSCOPIC APPENDECTOMY   06/06/2012   Procedure: APPENDECTOMY LAPAROSCOPIC;  Surgeon: Liz Malady, MD;  Location: [facility] OR;  Service: General;  Laterality: N/A;       Home Medications    Prior to Admission medications   Medication Sig Start Date End Date Taking? Authorizing Provider  predniSONE (DELTASONE) 10 MG tablet 3 tabs by mouth daily for 4 days then decrease by 1/2 tab or 5 mg daily until off. 03/22/21   Julieanne Manson, MD  sertraline (ZOLOFT) 50 MG tablet 1/2 tab by mouth deaily Patient not taking: Reported on 03/22/2021 08/24/20   Julieanne Manson, MD  triamcinolone cream (KENALOG) 0.1 % Apply to affected areas twice daily as needed. 03/31/21   Julieanne Manson, MD    Family History Family History  Problem Relation Age of Onset   Diabetes Mother    Anxiety disorder Mother    Hyperlipidemia Father    Hypertension Father    Gout Father     Social History Social History   Tobacco Use   Smoking status: Former    Packs/day: 0.10    Types: Cigarettes    Start date: 06/10/1996    Quit date: 06/23/2015    Years since quitting: 6.1   Smokeless tobacco: Never  Substance Use Topics   Alcohol use: Yes    Alcohol/week: 0.0 standard drinks    Comment: was drinking 80 oz of malt liquor until stomach  pain caused him to stop 06/2015   Drug use: No     Allergies   Patient has no known allergies.   Review of Systems Review of Systems  HENT:  Negative for congestion.   Respiratory:  Negative for shortness of breath and wheezing.   Cardiovascular:  Positive for chest pain.  Musculoskeletal:  Positive for back pain.    Physical Exam Triage Vital Signs ED Triage Vitals  Enc Vitals Group     BP 08/15/21 1656 109/71     Pulse Rate 08/15/21 1656 61     Resp 08/15/21 1656 14     Temp 08/15/21 1656 98.3 F (36.8 C)     Temp Source 08/15/21 1656 Oral     SpO2 08/15/21 1656 100 %     Weight --      Height --      Head Circumference --      Peak Flow --      Pain Score 08/15/21 1655 3      Pain Loc --      Pain Edu? --      Excl. in GC? --    No data found.  Updated Vital Signs BP 109/71 (BP Location: Right Arm)   Pulse 61   Temp 98.3 F (36.8 C) (Oral)   Resp 14   SpO2 100%   Visual Acuity Right Eye Distance:   Left Eye Distance:   Bilateral Distance:    Right Eye Near:   Left Eye Near:    Bilateral Near:     Physical Exam Constitutional:      General: He is not in acute distress.    Appearance: Normal appearance. He is normal weight. He is not ill-appearing.  Cardiovascular:     Rate and Rhythm: Normal rate and regular rhythm.  Pulmonary:     Effort: Pulmonary effort is normal.     Breath sounds: Normal breath sounds.  Chest:     Chest wall: Tenderness present. No deformity.    Musculoskeletal:     Right shoulder: Normal.     Thoracic back: Tenderness present. No edema, deformity or bony tenderness.       Back:  Neurological:     Mental Status: He is alert.     UC Treatments / Results  Labs (all labs ordered are listed, but only abnormal results are displayed) Labs Reviewed - No data to display  EKG   Radiology CXR 08/15/21: No active cardiopulmonary disease.  Procedures Procedures (including critical care time)  Medications Ordered in UC Medications - No data to display  Initial Impression / Assessment and Plan / UC Course  I have reviewed the triage vital signs and the nursing notes.  Pertinent labs & imaging results that were available during my care of the patient were reviewed by me and considered in my medical decision making (see chart for details).  Reassured patient that there does not seem to be any sign of lung damage on the chest x-ray nor on physical exam.  Encouraged him to follow package safety instructions when using chemicals.  Reassured him that his right upper back and right upper chest tenderness is probably a muscle strain and encouraged patient to use ibuprofen.   Final Clinical Impressions(s) / UC  Diagnoses   Final diagnoses:  Muscle strain  Exposure to chemical irritant     Discharge Instructions      You can use ibuprofen if needed for your back and chest pain. Your chest x-ray  is normal - no damage to your lungs. Make sure to follow product instructions when using chemicals - such as only using in open or well-ventilated areas.      ED Prescriptions   None    PDMP not reviewed this encounter.   Cathlyn Parsons, NP 08/18/21 1224

## 2021-09-21 ENCOUNTER — Ambulatory Visit: Payer: Self-pay | Admitting: Internal Medicine

## 2021-10-26 ENCOUNTER — Ambulatory Visit: Payer: Self-pay | Admitting: Internal Medicine

## 2021-11-06 ENCOUNTER — Ambulatory Visit: Payer: Self-pay | Admitting: Internal Medicine

## 2021-11-06 ENCOUNTER — Other Ambulatory Visit: Payer: Self-pay

## 2021-11-06 ENCOUNTER — Encounter: Payer: Self-pay | Admitting: Internal Medicine

## 2021-11-06 VITALS — BP 106/70 | HR 60 | Resp 12 | Ht 68.0 in | Wt 199.0 lb

## 2021-11-06 DIAGNOSIS — M545 Low back pain, unspecified: Secondary | ICD-10-CM

## 2021-11-06 MED ORDER — PREDNISONE 10 MG PO TABS: ORAL_TABLET | ORAL | 0 refills | Status: DC

## 2021-11-06 MED ORDER — CYCLOBENZAPRINE HCL 10 MG PO TABS: ORAL_TABLET | ORAL | 0 refills | Status: DC

## 2021-11-06 NOTE — Progress Notes (Signed)
° ° °  Subjective:    Patient ID: Frank Wilcox, male   DOB: 1980-11-08, 41 y.o.   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  He had stepped down from a truck and was climbing a short ladder to get into the home they were working on and developed sudden pain in bilateral low back.  Does not recall twisting or movement with trunk/bending over.   No history or injury.  He has never had this before. Has "pulses" of pain on left low back only. No weakness in legs. Pain radiates at times into his legs--anterior thighs to his knees.   No loss of bowel or bladder continence. No dysuria, urinary frequency, fever.   He thought it would get better as he worked putting up tile on a backsplash, but it did not.   Went home and drank lots of fluids in case was his kidneys.   Thought he was feeling better on Sunday--did not have work, but this morning no better.   Has also tried Ibuprofen 400 mg twice for the pain--helped just a little.  Last time took meds was Sunday morning.     No outpatient medications have been marked as taking for the 11/06/21 encounter (Office Visit) with Julieanne Manson, MD.   No Known Allergies   Review of Systems    Objective:   BP 106/70 (BP Location: Left Arm, Patient Position: Sitting, Cuff Size: Normal)    Pulse 60    Resp 12    Ht 5\' 8"  (1.727 m)    Wt 199 lb (90.3 kg)    BMI 30.26 kg/m   Physical Exam NAD until lies back or sits up from lying on exam table.  Moves easily from chair to exam table Lungs:  CTA CV:  RRR without murmur or rub.  Radial pulses normal and equal Abd:  S, NT, No HSM or mass, + BS.  No flank tenderness bilaterally. MS:  NT down L/S spinous processes or over paraspinous musculature. Neuro:  negative straight leg raise.   Motor 5/5 and DTRs 2+/4 throughout.  Gait normal     Assessment & Plan    Acute Bilateral Low Back Pain.   Prednisone burst and taper starting at 40 mg for 4 days and then tapering by 5 mg daily until off.  Cyclobenzaprine mainly for rest at night.  Letter to be off work for 3 days.  To gradually increase activity.

## 2021-11-06 NOTE — Patient Instructions (Signed)
Prednisone 10 mg:  Day 1-4:  4 tabs by mouth daily Day 5:  3 1/2 tabs Day 6:  3 tabs Day 7:  2 1/2 tabs Day 8:  2 tabs Day 9:  1 1/2 tabs Day 10: 1 tab Day 11:  1/2 tab Day 12:  done

## 2021-12-05 ENCOUNTER — Ambulatory Visit: Payer: Self-pay | Admitting: Internal Medicine

## 2022-03-13 ENCOUNTER — Ambulatory Visit: Payer: Self-pay | Admitting: Internal Medicine

## 2022-03-13 ENCOUNTER — Encounter: Payer: Self-pay | Admitting: Internal Medicine

## 2022-03-13 VITALS — BP 110/64 | HR 68 | Resp 12 | Ht 68.0 in | Wt 201.0 lb

## 2022-03-13 DIAGNOSIS — R1013 Epigastric pain: Secondary | ICD-10-CM

## 2022-03-13 MED ORDER — FAMOTIDINE 40 MG PO TABS: ORAL_TABLET | ORAL | 3 refills | Status: DC

## 2022-03-13 NOTE — Progress Notes (Signed)
    Subjective:    Patient ID: Frank Wilcox, male   DOB: August 14, 1981, 41 y.o.   MRN: [id number]   HPI  Frank Wilcox interprets  Epigastric pain:  More focused at right side of epigastrium.  Describes a "pulsing like pain in the area of pain.  Also feels inflamed/bloated.  In the morning, has a lot of belching and phlegm in throat.  No definite acid taste in mouth.   Initially, did have radiation to his back, but with changes to diet and medication, not really having that radiation any longer.   Seemed to come on suddenly in recent weeks--cannot say for sure how long.   Notes the discomfort most significantly in the afternoon when working.  More so if bent over.   No change in BMs--formed with normal color.  No melena or hematochezia. May have had mild nausea when first started, but since using the pepto bismal equivalent, no longer has that.   Notes the pain is reduced after eating.  Started off every day, but he recently started a reflux medication.--some medication from [place].  [facility], which is similar to [employer].   No alcohol since last time here, no coffee in past month, no caffeinated drinks other than maybe caffeinated vs herbal tea.  Does drink a lot of unsweetened tea.  No smoking.  Does eat onions and tomatoes regularly.  At times will have associated HA.     No outpatient medications have been marked as taking for the 03/13/22 encounter (Office Visit) with Julieanne Manson, MD.   No Known Allergies   Review of Systems    Objective:   BP 110/64 (BP Location: Left Arm, Patient Position: Sitting, Cuff Size: Normal)   Pulse 68   Resp 12   Ht 5\' 8"  (1.727 m)   Wt 201 lb (91.2 kg)   BMI 30.56 kg/m   Physical Exam Anxious HEENT:  PERRL, EOMI Neck:  Supple, No adenopathy, no thyromegaly Chest:  CTA CV:  RRR without murmur or rub.  Radial pulses normal and equal Abd:  S, NT, No HSM or mass, + BS LE:  No edema.   Assessment & Plan    Epigastric pain:  likely gastritis and GERD:  Famotidine 40 mg at bedtime for at least 2 months.  Hold pepto bismal like med.  CBC, CMP.  Gallbladder disease very unlikely as better with eating.   Discussed unlikely to have anything to do with liver disease--he is very concerned about this with his history of alcohol abuse.   GERD handout. Follow up in 2 months.

## 2022-03-14 LAB — COMPREHENSIVE METABOLIC PANEL
ALT: 21 IU/L (ref 0–44)
AST: 19 IU/L (ref 0–40)
Albumin/Globulin Ratio: 1.8 (ref 1.2–2.2)
Albumin: 4.6 g/dL (ref 4.0–5.0)
Alkaline Phosphatase: 65 IU/L (ref 44–121)
BUN/Creatinine Ratio: 14 (ref 9–20)
BUN: 19 mg/dL (ref 6–20)
Bilirubin Total: 0.3 mg/dL (ref 0.0–1.2)
CO2: 24 mmol/L (ref 20–29)
Calcium: 9 mg/dL (ref 8.7–10.2)
Chloride: 101 mmol/L (ref 96–106)
Creatinine, Ser: 1.34 mg/dL — ABNORMAL HIGH (ref 0.76–1.27)
Globulin, Total: 2.5 g/dL (ref 1.5–4.5)
Glucose: 90 mg/dL (ref 70–99)
Potassium: 4.3 mmol/L (ref 3.5–5.2)
Sodium: 138 mmol/L (ref 134–144)
Total Protein: 7.1 g/dL (ref 6.0–8.5)
eGFR: 69 mL/min/{1.73_m2} (ref >59)

## 2022-03-14 LAB — CBC WITH DIFFERENTIAL/PLATELET
Basophils Absolute: 0.1 10*3/uL (ref 0.0–0.2)
Basos: 1 %
EOS (ABSOLUTE): 0.1 10*3/uL (ref 0.0–0.4)
Eos: 1 %
Hematocrit: 44.1 % (ref 37.5–51.0)
Hemoglobin: 14.8 g/dL (ref 13.0–17.7)
Immature Grans (Abs): 0 10*3/uL (ref 0.0–0.1)
Immature Granulocytes: 0 %
Lymphocytes Absolute: 2.9 10*3/uL (ref 0.7–3.1)
Lymphs: 36 %
MCH: 31.6 pg (ref 26.6–33.0)
MCHC: 33.6 g/dL (ref 31.5–35.7)
MCV: 94 fL (ref 79–97)
Monocytes Absolute: 0.5 10*3/uL (ref 0.1–0.9)
Monocytes: 6 %
Neutrophils Absolute: 4.6 10*3/uL (ref 1.4–7.0)
Neutrophils: 56 %
Platelets: 202 10*3/uL (ref 150–450)
RBC: 4.69 x10E6/uL (ref 4.14–5.80)
RDW: 12.9 % (ref 11.6–15.4)
WBC: 8 10*3/uL (ref 3.4–10.8)

## 2022-04-27 ENCOUNTER — Telehealth: Payer: Self-pay

## 2022-04-27 MED ORDER — TRIAMCINOLONE ACETONIDE 0.1 % EX CREA: 1.0000 | TOPICAL_CREAM | Freq: Two times a day (BID) | CUTANEOUS | 0 refills | Status: DC

## 2022-04-27 NOTE — Telephone Encounter (Signed)
Patient was told soak in Aveeno oatmeal soothing bath treatment with cold water to stop the itching. I also sent an Rx of Kenalog cream and instructed patient to apply twice daily on affected areas. Instructed patient to call if rash gets worse or does not get better.

## 2022-04-27 NOTE — Telephone Encounter (Signed)
Patient would like a recommendation on medication he can use for poison ivy. Patient developed a rash after doing some yard work 2 weeks ago. Has been itchy with a rash all over his body since. Patient has used Zanfel but it has not helped any. Would like any medications recommended sent to the [facility] on [address]

## 2022-05-01 ENCOUNTER — Encounter: Payer: Self-pay | Admitting: Internal Medicine

## 2022-05-01 ENCOUNTER — Ambulatory Visit: Payer: Self-pay | Admitting: Internal Medicine

## 2022-05-01 VITALS — BP 124/74 | HR 76 | Resp 16 | Ht 68.0 in | Wt 201.0 lb

## 2022-05-01 DIAGNOSIS — L237 Allergic contact dermatitis due to plants, except food: Secondary | ICD-10-CM

## 2022-05-01 MED ORDER — CYCLOBENZAPRINE HCL 10 MG PO TABS
ORAL_TABLET | ORAL | 0 refills | Status: DC
Start: 2022-05-01 — End: 2023-11-25

## 2022-05-01 MED ORDER — CLOBETASOL PROPIONATE 0.05 % EX CREA: 1.0000 | TOPICAL_CREAM | Freq: Two times a day (BID) | CUTANEOUS | 0 refills | Status: DC

## 2022-05-01 NOTE — Progress Notes (Signed)
    Subjective:    Patient ID: Frank Wilcox, male   DOB: Dec 27, 1980, 41 y.o.   MRN: [id number]   HPI  Here as states his poison ivy is not getting any better with cool water soaks once daily and application of Triamcinolone cream twice daily.  Also would like medication for his low back pain  Current Meds  Medication Sig   famotidine (PEPCID) 40 MG tablet 1 tab by mouth at bedtime daily.   triamcinolone cream (KENALOG) 0.1 % Apply 1 Application topically 2 (two) times daily.   No Known Allergies   Review of Systems    Objective:   BP 124/74 (BP Location: Left Arm, Patient Position: Sitting, Cuff Size: Normal)   Pulse 76   Resp 16   Ht 5\' 8"  (1.727 m)   Wt 201 lb (91.2 kg)   BMI 30.56 kg/m   Physical Exam Scattered minimal lesions--mostly dried on forearms, though some with clear vesicles on red base and scattered on abdomen and legs.  Generally in linear distribution.     Assessment & Plan  Poison Ivy:  try clobetasol cream

## 2022-05-10 ENCOUNTER — Ambulatory Visit: Payer: Self-pay | Admitting: Internal Medicine

## 2022-07-05 ENCOUNTER — Ambulatory Visit: Payer: Self-pay | Admitting: Internal Medicine

## 2022-07-05 IMAGING — DX DG CHEST 2V
2 series · 2 of 2 positions shown · non-contrast
Comparison: None.

CLINICAL DATA: Inhaled chemicals

EXAM:
CHEST - 2 VIEW

[chest pa]
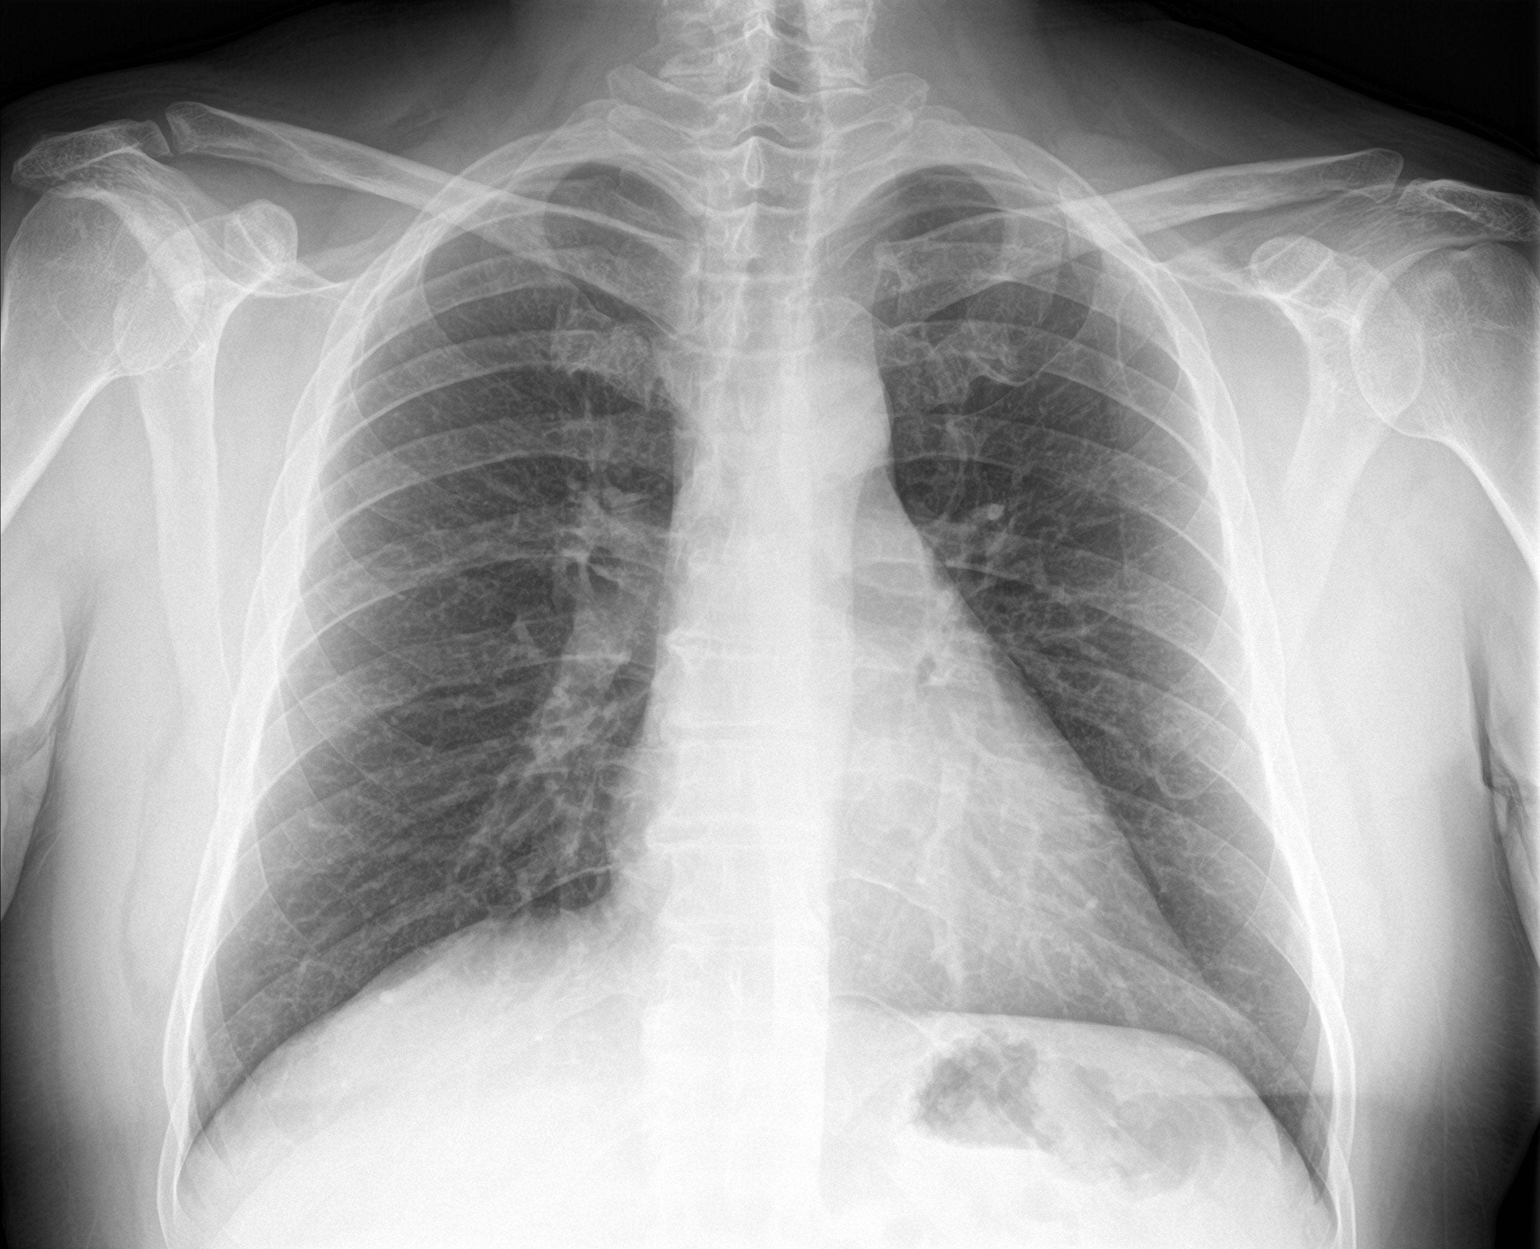

[chest lat]
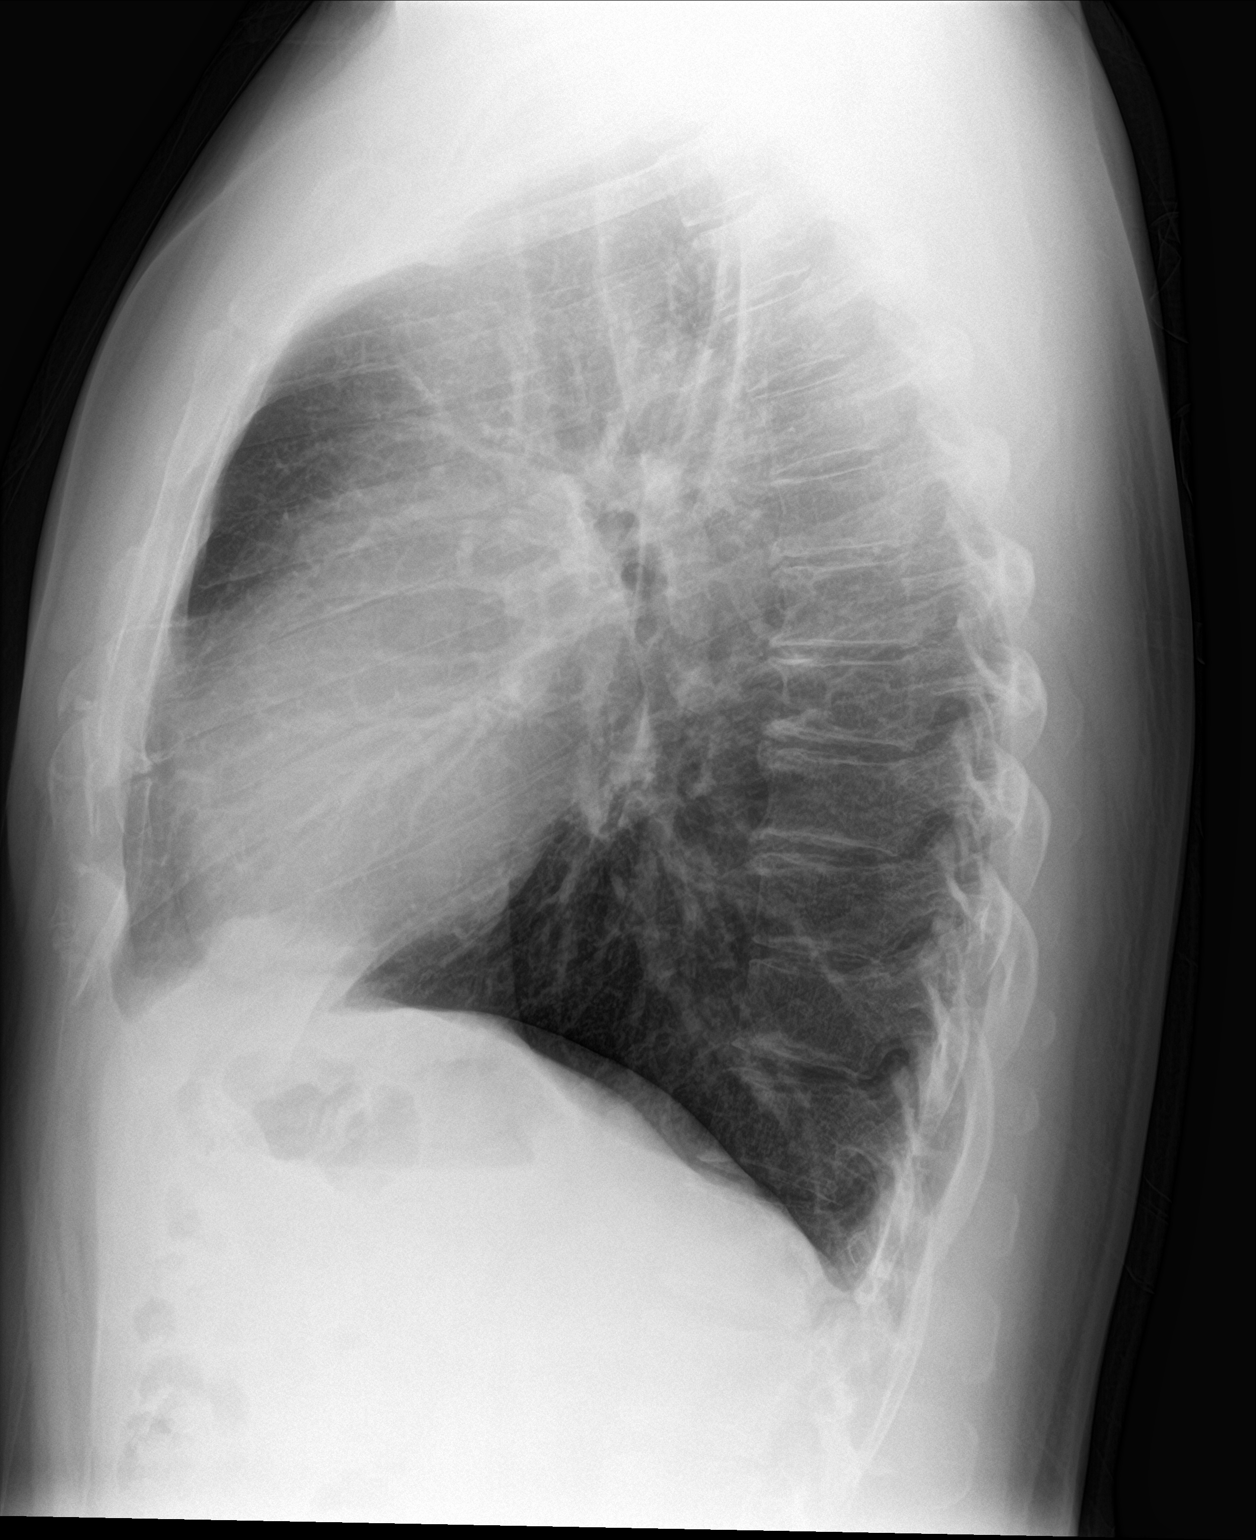

[2 of 2 positions shown; findings below may reference images not displayed]

FINDINGS: The heart size and mediastinal contours are within normal limits.
Both lungs are clear. The visualized skeletal structures are
unremarkable.
IMPRESSION: No active cardiopulmonary disease.

## 2022-09-20 ENCOUNTER — Ambulatory Visit: Payer: Self-pay | Admitting: Internal Medicine

## 2022-09-20 ENCOUNTER — Encounter: Payer: Self-pay | Admitting: Internal Medicine

## 2022-09-20 VITALS — BP 110/72 | HR 76 | Resp 16 | Ht 68.0 in | Wt 184.0 lb

## 2022-09-20 DIAGNOSIS — Z139 Encounter for screening, unspecified: Secondary | ICD-10-CM

## 2022-09-20 DIAGNOSIS — M79671 Pain in right foot: Secondary | ICD-10-CM

## 2022-09-20 DIAGNOSIS — I8393 Asymptomatic varicose veins of bilateral lower extremities: Secondary | ICD-10-CM

## 2022-09-20 DIAGNOSIS — B353 Tinea pedis: Secondary | ICD-10-CM

## 2022-09-20 DIAGNOSIS — R634 Abnormal weight loss: Secondary | ICD-10-CM

## 2022-09-20 DIAGNOSIS — R7989 Other specified abnormal findings of blood chemistry: Secondary | ICD-10-CM

## 2022-09-20 DIAGNOSIS — L853 Xerosis cutis: Secondary | ICD-10-CM

## 2022-09-20 MED ORDER — TERBINAFINE HCL 1 % EX CREA: 1.0000 | TOPICAL_CREAM | Freq: Two times a day (BID) | CUTANEOUS | 0 refills | Status: DC

## 2022-09-20 MED ORDER — TEA TREE 100 % EX OIL: TOPICAL_OIL | CUTANEOUS | Status: DC

## 2022-09-20 NOTE — Progress Notes (Signed)
     Subjective:    Patient ID: Frank Wilcox, male   DOB: July 23, 1981, 41 y.o.   MRN: [id number]   HPI  Frank Wilcox interprets   Pain on plantar aspects of feet for about 2 months.  Comes and goes, but seems worse when working.  On hard floors much of day with work.  Tiles shower floors.  Feels the pain was mainly from the arch to the base of toes and not so much the plantar heel.   Finally, states the pain resolved about 2 weeks ago.   2.  Elevated creatinine in May.  Has not been back for follow up  3.  Concern for risk of DM:  Both parents have.  4.  HM:  would like fasting screening labs and to be set up with CPE.  Has not eaten since 7 a.m. and only had water the rest of the day.   Current Meds  Medication Sig   cyclobenzaprine (FLEXERIL) 10 MG tablet 1/2 to 1 tab at bedtime as needed for back pain   No Known Allergies   Review of Systems    Objective:   BP 110/72 (BP Location: Left Arm, Patient Position: Sitting, Cuff Size: Normal)   Pulse 76   Resp 16   Ht 5\' 8"  (1.727 m)   Wt 184 lb (83.5 kg)   BMI 27.98 kg/m   Physical Exam Anxious Keeps picking and snapping at fingers. Lungs:  CTA CV:  RRR without murmur or rub  Radial and DP pulses normal and equal.  Varicosities of bilateral LE.   Feet:  no tenderness, plantar or dorsal aspects.  Generalized flaking of skin.   Assessment & Plan   Foot pain:  resolved.  Not clear etiology with normal exam today.  Encouraged good arch support and cushioning with work boots.    2.  Tinea pedis:  OTC terbinafine cream to affected areas twice daily for 14 day course or until flaking resolves.  Also, mix of Gold Bond foot cream with tea tree oil daily after bathing and for now after Terbinafine applied.    3.  HM:  FLP, CBC, CMP, A1C FIT test given to return in 2 weeks.  Return tomorrow for at least the flu vaccine  4.  Varicose veins:  encouraged elevation of legs when sitting and to consider graduated  compression stockings to thigh during day.  5.  Anxiety:  patient denies issue, but appears very anxious today.

## 2022-09-21 LAB — COMPREHENSIVE METABOLIC PANEL
ALT: 16 IU/L (ref 0–44)
AST: 25 IU/L (ref 0–40)
Albumin/Globulin Ratio: 2.3 — ABNORMAL HIGH (ref 1.2–2.2)
Albumin: 5 g/dL (ref 4.1–5.1)
Alkaline Phosphatase: 71 IU/L (ref 44–121)
BUN/Creatinine Ratio: 20 (ref 9–20)
BUN: 16 mg/dL (ref 6–24)
Bilirubin Total: 0.5 mg/dL (ref 0.0–1.2)
CO2: 21 mmol/L (ref 20–29)
Calcium: 9.5 mg/dL (ref 8.7–10.2)
Chloride: 101 mmol/L (ref 96–106)
Creatinine, Ser: 0.8 mg/dL (ref 0.76–1.27)
Globulin, Total: 2.2 g/dL (ref 1.5–4.5)
Glucose: 86 mg/dL (ref 70–99)
Potassium: 4.1 mmol/L (ref 3.5–5.2)
Sodium: 139 mmol/L (ref 134–144)
Total Protein: 7.2 g/dL (ref 6.0–8.5)
eGFR: 115 mL/min/{1.73_m2} (ref >59)

## 2022-09-21 LAB — CBC WITH DIFFERENTIAL/PLATELET
Basophils Absolute: 0.1 10*3/uL (ref 0.0–0.2)
Basos: 1 %
EOS (ABSOLUTE): 0 10*3/uL (ref 0.0–0.4)
Eos: 0 %
Hematocrit: 43.9 % (ref 37.5–51.0)
Hemoglobin: 14.6 g/dL (ref 13.0–17.7)
Immature Grans (Abs): 0 10*3/uL (ref 0.0–0.1)
Immature Granulocytes: 0 %
Lymphocytes Absolute: 2.7 10*3/uL (ref 0.7–3.1)
Lymphs: 31 %
MCH: 30.9 pg (ref 26.6–33.0)
MCHC: 33.3 g/dL (ref 31.5–35.7)
MCV: 93 fL (ref 79–97)
Monocytes Absolute: 0.5 10*3/uL (ref 0.1–0.9)
Monocytes: 6 %
Neutrophils Absolute: 5.4 10*3/uL (ref 1.4–7.0)
Neutrophils: 62 %
Platelets: 209 10*3/uL (ref 150–450)
RBC: 4.73 x10E6/uL (ref 4.14–5.80)
RDW: 12.4 % (ref 11.6–15.4)
WBC: 8.7 10*3/uL (ref 3.4–10.8)

## 2022-09-21 LAB — LIPID PANEL W/O CHOL/HDL RATIO
Cholesterol, Total: 164 mg/dL (ref 100–199)
HDL: 58 mg/dL (ref >39)
LDL Chol Calc (NIH): 95 mg/dL (ref 0–99)
Triglycerides: 54 mg/dL (ref 0–149)
VLDL Cholesterol Cal: 11 mg/dL (ref 5–40)

## 2022-09-21 LAB — HGB A1C W/O EAG: Hgb A1c MFr Bld: 4.9 % (ref 4.8–5.6)

## 2022-11-22 ENCOUNTER — Encounter: Payer: Self-pay | Admitting: Internal Medicine

## 2022-11-22 ENCOUNTER — Ambulatory Visit: Payer: Self-pay | Admitting: Internal Medicine

## 2022-11-22 VITALS — BP 110/70 | HR 76 | Resp 16 | Ht 68.0 in | Wt 190.0 lb

## 2022-11-22 DIAGNOSIS — R300 Dysuria: Secondary | ICD-10-CM

## 2022-11-22 DIAGNOSIS — Z9189 Other specified personal risk factors, not elsewhere classified: Secondary | ICD-10-CM

## 2022-11-22 DIAGNOSIS — R1032 Left lower quadrant pain: Secondary | ICD-10-CM

## 2022-11-22 DIAGNOSIS — Z1211 Encounter for screening for malignant neoplasm of colon: Secondary | ICD-10-CM

## 2022-11-22 DIAGNOSIS — Z0000 Encounter for general adult medical examination without abnormal findings: Secondary | ICD-10-CM

## 2022-11-22 LAB — POCT URINALYSIS DIPSTICK
Bilirubin, UA: NEGATIVE
Blood, UA: NEGATIVE
Glucose, UA: NEGATIVE
Ketones, UA: NEGATIVE
Leukocytes, UA: NEGATIVE
Nitrite, UA: NEGATIVE
Protein, UA: NEGATIVE
Spec Grav, UA: <=1.005 — AB (ref 1.010–1.025)
Urobilinogen, UA: 0.2 E.U./dL
pH, UA: 6 (ref 5.0–8.0)

## 2022-11-22 LAB — POC FIT TEST STOOL: Fecal Occult Blood: NEGATIVE

## 2022-11-22 NOTE — Progress Notes (Signed)
Subjective:    Patient ID: Frank Wilcox, male   DOB: 1981/05/30, 42 y.o.   MRN: [id number]   HPI  Frank Wilcox interprets  Here for Male CPE:  1.  STE:  Does perform.  No concerning findings.  No family history of testicular cancer.    2.  PSA:  Never.  No family history of prostate cancer.    3.  Guaiac Cards/FIT:  Never.    4.  Colonoscopy: Never.  No family history of colon cancer.  He has had EGD x 2 in past and treated for H. Pylori gastritis.  Follow up stool antigen negative in 2018.    5.  Cholesterol/Glucose:  Fasting lipids and glucose at goal recently.  A1C 4.9% as well. Lipid Panel     Component Value Date/Time   CHOL 164 09/20/2022 1659   TRIG 54 09/20/2022 1659   HDL 58 09/20/2022 1659   CHOLHDL 2.7 04/29/2014 0839   VLDL 13 04/29/2014 0839   LDLCALC 95 09/20/2022 1659   LDLDIRECT 112 (H) 07/15/2012 1542   LABVLDL 11 09/20/2022 1659     6.  Immunizations:  Did not get influenza vaccine this fall.  He did not get COVID booster for the year as well. Immunization History  Administered Date(s) Administered   Influenza,inj,Quad PF,6+ Mos 11/24/2015, 08/04/2018   Tdap 12/07/2013     Current Meds  Medication Sig   cyclobenzaprine (FLEXERIL) 10 MG tablet 1/2 to 1 tab at bedtime as needed for back pain   Tea Tree 100 % OIL Mezcla con crema de Gold Bond por pies y [employer] al dia para sus pies.   terbinafine (LAMISIL) 1 % cream Apply 1 Application topically 2 (two) times daily. Aplica dos veces al dia para sus pies y dedos por 50 dias   No Known Allergies  Past Medical History:  Diagnosis Date   Alcohol abuse    Anxiety    Esophageal reflux    H. pylori infection    Sleep disturbance, unspecified    Unspecified gastritis and gastroduodenitis without Wilcox of hemorrhage    Past Surgical History:  Procedure Laterality Date   ANKLE SURGERY Right 2007   ORIF right ankle fractures--work related injury   LAPAROSCOPIC APPENDECTOMY   06/06/2012   Procedure: APPENDECTOMY LAPAROSCOPIC;  Surgeon: Zenovia Jarred, MD;  Location: [place];  Service: General;  Laterality: N/A;   Family History  Problem Relation Age of Onset   Hypertension Mother    Diabetes Mother    Anxiety disorder Mother    Hyperlipidemia Father    Hypertension Father    Gout Father    Heart murmur Daughter        innocent   Other Son        Fatty liver, prediabetes, history of obesity, but health improved with weight loss.   Social History   Socioeconomic History   Marital status: Married    Spouse name: [name]   Number of children: 3   Years of education: 12   Highest education level: 12th grade  Occupational History   Occupation: Agricultural consultant: OTHER  Tobacco Use   Smoking status: Former    Packs/day: 0.10    Types: Cigarettes    Start date: 06/10/1996    Quit date: 06/23/2015    Years since quitting: 7.4    Passive exposure: Past   Smokeless tobacco: Never  Vaping Use   Vaping Use: Never used  Substance and Sexual  Activity   Alcohol use: Not Currently    Comment: Stopped drinking completely 2021.  History of overuse.   Drug use: No   Sexual activity: Yes    Partners: Female    Birth control/protection: Condom  Other Topics Concern   Not on file  Social History Narrative   Originally from [place]   Moved to [place] In 2004.   Lives at home with wife and 3 children   Understands English, but much more comfortable with Spanish   Social Determinants of Health   Financial Resource Strain: Low Risk  (11/22/2022)   Overall Financial Resource Strain (CARDIA)    Difficulty of Paying Living Expenses: Not hard at all  Food Insecurity: No Food Insecurity (11/22/2022)   Hunger Vital Sign    Worried About Running Out of Food in the Last Year: Never true    Ran Out of Food in the Last Year: Never true  Transportation Needs: No Transportation Needs (11/22/2022)   PRAPARE - Hydrologist (Medical): No     Lack of Transportation (Non-Medical): No  Physical Activity: Not on file  Stress: No Stress Concern Present (08/24/2020)   [place]    Feeling of Stress : Only a little  Social Connections: Socially Integrated (08/24/2020)   Social Connection and Isolation Panel [NHANES]    Frequency of Communication with Friends and Family: More than three times a week    Frequency of Social Gatherings with Friends and Family: More than three times a week    Attends Religious Services: More than 4 times per year    Active Member of [employer] or Organizations: Yes    Attends Archivist Meetings: 1 to 4 times per year    Marital Status: Married  Human resources officer Violence: Not At Risk (11/22/2022)   Humiliation, Afraid, Rape, and Kick questionnaire    Fear of Current or Ex-Partner: No    Emotionally Abused: No    Physically Abused: No    Sexually Abused: No      Review of Systems  Gastrointestinal:        LLQ intermittent pain for past 2-3 weeks, describes as medial to ASIS. Feels the area is really hot.  Does not know how to describe Can last up to 8 hours or more. Has around once to twice weekly. Generally occurs if too full or right before he has a BM. Better after passing a stool. No melena or hematochezia.   He does at times have hard stools, but has not paid attn to whether he has the pain then.  No loose or watery stools.   He does note when he does not drink his 2 small cups of coffee in the morning, he does not have a BM.  Cannot say if those are the days he does have LLQ pain.   Genitourinary:        Today for 2 episodes of urination, has a "weird" feeling in his lower abdomen and on perineum.      Objective:   BP 110/70 (BP Location: Left Arm, Patient Position: Sitting, Cuff Size: Normal)   Pulse 76   Resp 16   Ht 5\' 8"  (1.727 m)   Wt 190 lb (86.2 kg)   BMI 28.89 kg/m   Physical Exam Constitutional:       Appearance: Normal appearance. He is well-developed.  HENT:     Head: Normocephalic and atraumatic.  Right Ear: Tympanic membrane, ear canal and external ear normal.     Left Ear: Tympanic membrane, ear canal and external ear normal.     Nose: Nose normal.     Mouth/Throat:     Mouth: Mucous membranes are moist.     Pharynx: Oropharynx is clear.  Eyes:     Extraocular Movements: Extraocular movements intact.     Conjunctiva/sclera: Conjunctivae normal.     Pupils: Pupils are equal, round, and reactive to light.     Comments: Discs sharp  Neck:     Thyroid: No thyroid mass or thyromegaly.  Cardiovascular:     Rate and Rhythm: Normal rate and regular rhythm.     Heart sounds: S1 normal and S2 normal. No murmur heard.    No friction rub. No S3 or S4 sounds.     Comments: No carotid bruits.  Carotid, radial, femoral, DP and PT pulses normal and equal.   Pulmonary:     Effort: Pulmonary effort is normal.     Breath sounds: Normal breath sounds and air entry.  Abdominal:     General: Abdomen is flat. Bowel sounds are normal.     Palpations: Abdomen is soft. There is no hepatomegaly, splenomegaly or mass.     Tenderness: There is no abdominal tenderness. There is no right CVA tenderness or left CVA tenderness.     Hernia: No hernia is present.  Genitourinary:    Penis: Circumcised.      Testes:        Right: Mass or tenderness not present. Right testis is descended.        Left: Mass or tenderness not present. Left testis is descended.  Musculoskeletal:        General: Normal range of motion.     Cervical back: Normal range of motion and neck supple.     Right lower leg: No edema.     Left lower leg: No edema.  Lymphadenopathy:     Head:     Right side of head: No submental or submandibular adenopathy.     Left side of head: No submental or submandibular adenopathy.     Cervical: No cervical adenopathy.     Upper Body:     Right upper body: No supraclavicular  adenopathy.     Left upper body: No supraclavicular adenopathy.     Lower Body: No right inguinal adenopathy. No left inguinal adenopathy.  Skin:    General: Skin is warm and dry.     Capillary Refill: Capillary refill takes less than 2 seconds.     Findings: No rash.  Neurological:     General: No focal deficit present.     Mental Status: He is alert and oriented to person, place, and time.      Assessment & Plan    CPE Turned in finished FIT today--negative for blood Refused COViD.  2.  LLQ pain:  to pay attention to eating, drinking coffee and days constipated and return if pain continues.  Discussed okay to have 2 small cups coffee in the morning.  3.  Referral for dental care.  4.  Dysuria:  no findings on exam. UA normal.  To call if symptoms continue/worsen

## 2023-01-25 ENCOUNTER — Telehealth: Payer: Self-pay

## 2023-01-25 NOTE — Telephone Encounter (Signed)
Patient needs an appointment , patient said his back hurts, he has been with a lot of phlegm and he feels like if his nose was closed, patient stated he would like his lungs to be checked.

## 2023-01-30 ENCOUNTER — Ambulatory Visit: Payer: Self-pay | Admitting: Internal Medicine

## 2023-01-30 ENCOUNTER — Encounter: Payer: Self-pay | Admitting: Internal Medicine

## 2023-01-30 VITALS — BP 114/60 | HR 80 | Resp 12 | Ht 68.0 in | Wt 184.0 lb

## 2023-01-30 DIAGNOSIS — R0600 Dyspnea, unspecified: Secondary | ICD-10-CM

## 2023-01-30 DIAGNOSIS — R0981 Nasal congestion: Secondary | ICD-10-CM

## 2023-01-30 MED ORDER — LORATADINE 10 MG PO TABS: 10.0000 mg | ORAL_TABLET | Freq: Every day | ORAL | 11 refills | Status: AC

## 2023-01-30 MED ORDER — MOMETASONE FUROATE 50 MCG/ACT NA SUSP: 2.0000 | Freq: Every day | NASAL | 12 refills | Status: AC

## 2023-01-30 NOTE — Progress Notes (Signed)
    Subjective:    Patient ID: Frank Wilcox, male   DOB: September 23, 1981, 42 y.o.   MRN: [id number]   HPI  Tereasa Coop interprets   Upper thoracic back pain, mainly on right, but rarely left involved as well.  Has noted for about 2 weeks.  It seems he is correlating his back pain with what he also feels may be damage to his lung.  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  He does use a mask when exposed to the dust.   He wears a mask when dust in work environment.  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   Last week also noted discomfort in his right anterior chest along with back pain.   Currently, his nose is congested, he at times feels short of breath and can have right anterior chest discomfort.  When blows his nose, nothing comes out.  Currently, no cough.   Has used Afrin nasal spray, but knew not to continue beyond 3 days,  and syrup for cold and flu.  Mild help.  States did not feel well with this combination. Does not feel his symptoms have worsened with pollen.      Current Meds  Medication Sig   cyclobenzaprine (FLEXERIL) 10 MG tablet 1/2 to 1 tab at bedtime as needed for back pain   Tea Tree 100 % OIL Mezcla con crema de Gold Bond por pies y [employer] al dia para sus pies.   No Known Allergies   Review of Systems    Objective:   BP 114/60 (BP Location: Left Arm, Patient Position: Sitting, Cuff Size: Normal)   Pulse 80   Resp 12   Ht  (1.727 m)   Wt 184 lb (83.5 kg)   BMI 27.98 kg/m   Physical Exam NAD HEENT:  PERRL,EOMI, Conjunctivae without injection.  TMs pearly gray.  NT over sinuses.  Throat with mild cobbling.  Nasal mucosa swollen and without injection.  Clear nasal discharge. Neck:  Supple, No adenopathy Chest:  CTA with good air  movement CV:  RRR without murmur or rub.  Radial pulses normal and equal   Assessment & Plan   Probably allergies:  Neti Pot daily followed by Nasonex 2 sprays each nostril and Loratadine 10 mg daily when needed.  Use Nasonex daily for 2 months, then see if can stop or use throughout year only for symptoms.  Call if no improvement. His lungs are without findings, but check CXR with his concern for inhaled dust.   Discussed always using a mask, preferably with valve when working in dust.

## 2023-01-30 NOTE — Patient Instructions (Signed)
Neti Pot

## 2023-01-30 NOTE — Telephone Encounter (Signed)
Patient has been schedule.

## 2023-07-16 ENCOUNTER — Telehealth: Payer: Self-pay

## 2023-07-16 NOTE — Telephone Encounter (Signed)
Patient needs an appointment for stomach inflammation. Symptoms; inflamed stomach, light headache , backache and body itches. Patient concern might be something with his liver.  Patient has been feeling like this since 2 months ago , symptoms come and go.   Patient has drink home remedies.   We will call patient if there is a cancellation.  Patient is available any day at any time

## 2023-07-31 NOTE — Telephone Encounter (Signed)
Patient has been scheduled

## 2023-08-01 ENCOUNTER — Encounter: Payer: Self-pay | Admitting: Internal Medicine

## 2023-08-01 ENCOUNTER — Ambulatory Visit: Payer: Self-pay | Admitting: Internal Medicine

## 2023-08-01 VITALS — BP 100/76 | HR 66 | Resp 16 | Ht 68.0 in | Wt 207.0 lb

## 2023-08-01 DIAGNOSIS — R1013 Epigastric pain: Secondary | ICD-10-CM

## 2023-08-01 DIAGNOSIS — F1090 Alcohol use, unspecified, uncomplicated: Secondary | ICD-10-CM

## 2023-08-01 DIAGNOSIS — Z23 Encounter for immunization: Secondary | ICD-10-CM

## 2023-08-01 MED ORDER — OMEPRAZOLE 20 MG PO CPDR: 20.0000 mg | DELAYED_RELEASE_CAPSULE | Freq: Every day | ORAL | 3 refills | Status: DC

## 2023-08-01 NOTE — Progress Notes (Cosign Needed)
     Subjective:     Patient ID: Frank Wilcox, male    DOB: [birth year], 42 y.o.   MRN: [id number]  Chief Complaint  Patient presents with   stomach inflammation    HPI  Epigastric pain:  Intermittent epigastric pain since September, worse over the past month. Feels like his stomach is inflamed, especially in the morning when he first wakes up. Pain described as a pressure that radiates to back. Experiences relief in pain immediately after eating. States that between the end of June and mid August he was in [place], started drinking beer excessively and eating a lot of spicy food.        Concerns that he may be having problems with his liver. Started taking a natural supplement to        detox his liver 15 days ago. Has not had a drink since September. Skin has been itching        intermittently. Has also been taking Prilosec for the past 5 days, reports improvement in pain.        No blood or dark tarry stools. No nausea. No fever. Wife denies noticing any yellowing of the eyes        or skin. Has had clear phlegm after eating as well.   No Known Allergies   Current Meds  Medication Sig   loratadine (CLARITIN) 10 MG tablet Take 1 tablet (10 mg total) by mouth daily.   mometasone (NASONEX) 50 MCG/ACT nasal spray Place 2 sprays into the nose daily.   omeprazole (PRILOSEC) 20 MG capsule Take 1 capsule (20 mg total) by mouth daily.        Objective:    BP 100/76 (BP Location: Left Arm, Patient Position: Sitting, Cuff Size: Normal)   Pulse 66   Resp 16   Ht 5\' 8"  (1.727 m)   Wt 207 lb (93.9 kg)   BMI 31.47 kg/m    Physical Exam Constitutional:      Appearance: Normal appearance.  Cardiovascular:     Rate and Rhythm: Normal rate and regular rhythm.     Heart sounds: S1 normal and S2 normal. No murmur heard. Pulmonary:     Breath sounds: Normal breath sounds.  Abdominal:     General: Bowel sounds are normal. There is no distension.     Palpations: Abdomen is  soft. There is no hepatomegaly.     Tenderness: There is no abdominal tenderness.  Skin:    General: Skin is warm and dry.     Coloration: Skin is not jaundiced.  Psychiatric:        Mood and Affect: Mood is anxious.       Assessment & Plan:   Epigastric Pain: Likely gastritis, GERD, PUD: Continue taking Omeprazole 20 mg daily on an empty stomach for at least 2 months. Discussed avoiding triggering foods and alcohol. Will check CMP, CBC today. GERD handout provided. Follow up in 2 months.   Alcohol Use Disorder:  Refer to [facility] for counseling regarding alcohol use disorder. Referring to spanish speaking AA.  HM:  Influenza vaccine today.   Macarthur Critchley, Haroldine Laws FNP Student

## 2023-08-02 LAB — COMPREHENSIVE METABOLIC PANEL
ALT: 23 [IU]/L (ref 0–44)
AST: 20 [IU]/L (ref 0–40)
Albumin: 4.7 g/dL (ref 4.1–5.1)
Alkaline Phosphatase: 66 [IU]/L (ref 44–121)
BUN/Creatinine Ratio: 17 (ref 9–20)
BUN: 15 mg/dL (ref 6–24)
Bilirubin Total: 0.4 mg/dL (ref 0.0–1.2)
CO2: 20 mmol/L (ref 20–29)
Calcium: 9.6 mg/dL (ref 8.7–10.2)
Chloride: 102 mmol/L (ref 96–106)
Creatinine, Ser: 0.9 mg/dL (ref 0.76–1.27)
Globulin, Total: 2.3 g/dL (ref 1.5–4.5)
Glucose: 80 mg/dL (ref 70–99)
Potassium: 4.5 mmol/L (ref 3.5–5.2)
Sodium: 142 mmol/L (ref 134–144)
Total Protein: 7 g/dL (ref 6.0–8.5)
eGFR: 110 mL/min/{1.73_m2} (ref >59)

## 2023-08-02 LAB — CBC WITH DIFFERENTIAL/PLATELET
Basophils Absolute: 0.1 10*3/uL (ref 0.0–0.2)
Basos: 1 %
EOS (ABSOLUTE): 0 10*3/uL (ref 0.0–0.4)
Eos: 1 %
Hematocrit: 48 % (ref 37.5–51.0)
Hemoglobin: 15.8 g/dL (ref 13.0–17.7)
Immature Grans (Abs): 0 10*3/uL (ref 0.0–0.1)
Immature Granulocytes: 0 %
Lymphocytes Absolute: 2.3 10*3/uL (ref 0.7–3.1)
Lymphs: 36 %
MCH: 31.6 pg (ref 26.6–33.0)
MCHC: 32.9 g/dL (ref 31.5–35.7)
MCV: 96 fL (ref 79–97)
Monocytes Absolute: 0.6 10*3/uL (ref 0.1–0.9)
Monocytes: 9 %
Neutrophils Absolute: 3.5 10*3/uL (ref 1.4–7.0)
Neutrophils: 53 %
Platelets: 211 10*3/uL (ref 150–450)
RBC: 5 x10E6/uL (ref 4.14–5.80)
RDW: 12.3 % (ref 11.6–15.4)
WBC: 6.4 10*3/uL (ref 3.4–10.8)

## 2023-09-25 ENCOUNTER — Telehealth: Payer: Self-pay

## 2023-09-25 NOTE — Telephone Encounter (Signed)
Patient called to report that he has been experiencing leg pain, body pain, tremors, and fatigue for about 1 week. Symptoms have been reoccurring. Patient concerned that symptoms are due to A1c being high. Patient expressed that he really wants to get A1c check and has been anxious about his blood sugar being high.

## 2023-09-26 NOTE — Telephone Encounter (Signed)
Patient has been scheduled for labs  

## 2023-09-27 ENCOUNTER — Other Ambulatory Visit: Payer: Self-pay

## 2023-09-27 DIAGNOSIS — Z139 Encounter for screening, unspecified: Secondary | ICD-10-CM

## 2023-09-28 LAB — COMPREHENSIVE METABOLIC PANEL
ALT: 34 [IU]/L (ref 0–44)
AST: 33 [IU]/L (ref 0–40)
Albumin: 4.6 g/dL (ref 4.1–5.1)
Alkaline Phosphatase: 64 [IU]/L (ref 44–121)
BUN/Creatinine Ratio: 22 — ABNORMAL HIGH (ref 9–20)
BUN: 20 mg/dL (ref 6–24)
Bilirubin Total: 0.6 mg/dL (ref 0.0–1.2)
CO2: 24 mmol/L (ref 20–29)
Calcium: 9.3 mg/dL (ref 8.7–10.2)
Chloride: 100 mmol/L (ref 96–106)
Creatinine, Ser: 0.93 mg/dL (ref 0.76–1.27)
Globulin, Total: 2.4 g/dL (ref 1.5–4.5)
Glucose: 85 mg/dL (ref 70–99)
Potassium: 4.6 mmol/L (ref 3.5–5.2)
Sodium: 137 mmol/L (ref 134–144)
Total Protein: 7 g/dL (ref 6.0–8.5)
eGFR: 106 mL/min/{1.73_m2} (ref >59)

## 2023-09-28 LAB — CBC WITH DIFFERENTIAL/PLATELET
Basophils Absolute: 0.1 10*3/uL (ref 0.0–0.2)
Basos: 1 %
EOS (ABSOLUTE): 0 10*3/uL (ref 0.0–0.4)
Eos: 1 %
Hematocrit: 43.8 % (ref 37.5–51.0)
Hemoglobin: 15.2 g/dL (ref 13.0–17.7)
Immature Grans (Abs): 0 10*3/uL (ref 0.0–0.1)
Immature Granulocytes: 0 %
Lymphocytes Absolute: 2.8 10*3/uL (ref 0.7–3.1)
Lymphs: 34 %
MCH: 32.2 pg (ref 26.6–33.0)
MCHC: 34.7 g/dL (ref 31.5–35.7)
MCV: 93 fL (ref 79–97)
Monocytes Absolute: 0.6 10*3/uL (ref 0.1–0.9)
Monocytes: 8 %
Neutrophils Absolute: 4.6 10*3/uL (ref 1.4–7.0)
Neutrophils: 56 %
Platelets: 197 10*3/uL (ref 150–450)
RBC: 4.72 x10E6/uL (ref 4.14–5.80)
RDW: 12.3 % (ref 11.6–15.4)
WBC: 8.1 10*3/uL (ref 3.4–10.8)

## 2023-09-28 LAB — HEMOGLOBIN A1C
Est. average glucose Bld gHb Est-mCnc: 97 mg/dL
Hgb A1c MFr Bld: 5 % (ref 4.8–5.6)

## 2023-10-03 ENCOUNTER — Ambulatory Visit: Payer: Self-pay | Admitting: Internal Medicine

## 2023-10-03 VITALS — BP 108/62 | HR 66 | Resp 20 | Ht 68.0 in | Wt 207.0 lb

## 2023-10-03 DIAGNOSIS — I8393 Asymptomatic varicose veins of bilateral lower extremities: Secondary | ICD-10-CM

## 2023-10-03 DIAGNOSIS — R1013 Epigastric pain: Secondary | ICD-10-CM

## 2023-10-03 DIAGNOSIS — F1090 Alcohol use, unspecified, uncomplicated: Secondary | ICD-10-CM

## 2023-10-03 DIAGNOSIS — Z9189 Other specified personal risk factors, not elsewhere classified: Secondary | ICD-10-CM

## 2023-10-03 MED ORDER — OMEPRAZOLE 40 MG PO CPDR: DELAYED_RELEASE_CAPSULE | ORAL | 2 refills | Status: DC

## 2023-10-03 NOTE — Progress Notes (Unsigned)
    Subjective:    Patient ID: Frank Wilcox, male   DOB: [birth year], 42 y.o.   MRN: [id number]   HPI   Gastritis:  Is feeling better, but feels he needs a a bit stronger medication.  Still having a mild epigastric pressure.  Comes and goes.  Tomatoes and acid foods seem to trigger.  States he is no longer drinking alcohol    2.  Alcohol use disorder:  states has not heard from [facility].    3.  Need for dental care:  was unable to get his last visit as his orange card ran out.    4.  Varicose veins:  pain on both legs.  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      Current Meds  Medication Sig   mometasone (NASONEX) 50 MCG/ACT nasal spray Place 2 sprays into the nose daily.   omeprazole (PRILOSEC) 20 MG capsule Take 1 capsule (20 mg total) by mouth daily.   No Known Allergies   Review of Systems    Objective:   BP 108/62 (BP Location: Right Arm, Patient Position: Sitting, Cuff Size: Normal)   Pulse 66   Resp 20   Ht 5\' 8"  (1.727 m)   Wt 207 lb (93.9 kg)   BMI 31.47 kg/m   Physical Exam Mild mid epigastric tenderness Varicosities of bilateral LE  Assessment & Plan

## 2023-10-25 ENCOUNTER — Encounter: Payer: Self-pay | Admitting: Internal Medicine

## 2023-11-25 ENCOUNTER — Ambulatory Visit: Payer: Self-pay | Admitting: Internal Medicine

## 2023-11-25 ENCOUNTER — Encounter: Payer: Self-pay | Admitting: Internal Medicine

## 2023-11-25 VITALS — BP 110/70 | HR 74 | Resp 16 | Ht 68.0 in | Wt 212.0 lb

## 2023-11-25 DIAGNOSIS — Z0000 Encounter for general adult medical examination without abnormal findings: Secondary | ICD-10-CM

## 2023-11-25 DIAGNOSIS — Z23 Encounter for immunization: Secondary | ICD-10-CM

## 2023-11-25 DIAGNOSIS — A048 Other specified bacterial intestinal infections: Secondary | ICD-10-CM

## 2023-11-25 DIAGNOSIS — H547 Unspecified visual loss: Secondary | ICD-10-CM

## 2023-11-25 DIAGNOSIS — Z9189 Other specified personal risk factors, not elsewhere classified: Secondary | ICD-10-CM

## 2023-11-25 DIAGNOSIS — Z630 Problems in relationship with spouse or partner: Secondary | ICD-10-CM

## 2023-11-25 MED ORDER — OMEPRAZOLE 40 MG PO CPDR: DELAYED_RELEASE_CAPSULE | ORAL | 11 refills | Status: AC

## 2023-11-25 NOTE — Progress Notes (Signed)
 Subjective:    Patient ID: Frank Wilcox, male   DOB: Apr 22, 1981, 43 y.o.   MRN: [id number]   HPI  Here for Male CPE:  1.  STE:  Not performing recently.  No family history of testicular cancer.    2.  PSA: Never.  No family history of prostate cancer.    3.  Guaiac Cards/FIT:  Last checked in 11/2022 and negative for blood.    4.  Colonoscopy:  Never.  History of EGD last in 2017 with chronic H. Pylori.  Negative stool H. Pylori antigen in 2018 follow up.  5.  Cholesterol/Glucose:  Cholesterol has been fine in past.  Has had mildly elevated glucoses in past, but A1C always normal.  Last A1C in 10/2022 was 5.0%.    6.  Immunizations:  Due for Td today.  Has not had COVID booster this year.  Only had 2 original COVID vaccines likely in 2021.   Immunization History  Administered Date(s) Administered   Influenza, Mdck, Trivalent,PF 6+ MOS(egg free) 08/01/2023   Influenza,inj,Quad PF,6+ Mos 11/24/2015, 08/04/2018   Tdap 12/07/2013     Current Meds  Medication Sig   loratadine (CLARITIN) 10 MG tablet Take 1 tablet (10 mg total) by mouth daily.   mometasone (NASONEX) 50 MCG/ACT nasal spray Place 2 sprays into the nose daily.   omeprazole (PRILOSEC) 40 MG capsule 1 tab by mouth daily on empty stomach   No Known Allergies  Past Medical History:  Diagnosis Date   Alcohol abuse    Anxiety    Esophageal reflux    H. pylori infection 2017   Resolved with negative stool antigen in 2018   Sleep disturbance, unspecified    Unspecified gastritis and gastroduodenitis without mention of hemorrhage    Past Surgical History:  Procedure Laterality Date   ANKLE SURGERY Right 2007   ORIF right ankle fractures--work related injury   LAPAROSCOPIC APPENDECTOMY  06/06/2012   Procedure: APPENDECTOMY LAPAROSCOPIC;  Surgeon: Liz Malady, MD;  Location: [facility] OR;  Service: General;  Laterality: N/A;   Family History  Problem Relation Age of Onset   Hypertension Mother     Diabetes Mother    Anxiety disorder Mother    Hyperlipidemia Father    Hypertension Father    Gout Father    Heart murmur Daughter        innocent   Other Son        Fatty liver, prediabetes, history of obesity, but health improved with weight loss.   Social History   Socioeconomic History   Marital status: Married    Spouse name: [name]   Number of children: 3   Years of education: 12   Highest education level: 12th grade  Occupational History   Occupation: Therapist, sports: OTHER  Tobacco Use   Smoking status: Former    Current packs/day: 0.00    Average packs/day: 0.1 packs/day for 19.0 years (1.9 ttl pk-yrs)    Types: Cigarettes    Start date: 06/10/1996    Quit date: 06/23/2015    Years since quitting: 8.4    Passive exposure: Past   Smokeless tobacco: Never  Vaping Use   Vaping status: Never Used  Substance and Sexual Activity   Alcohol use: Not Currently    Comment: Stopped drinking completely 2021.  History of overuse.   Drug use: No   Sexual activity: Yes    Partners: Female    Birth control/protection: Condom  Other Topics Concern  Not on file  Social History Narrative   Lives at home with wife and 3 children   Social Drivers of Health   Financial Resource Strain: Low Risk  (11/25/2023)   Overall Financial Resource Strain (CARDIA)    Difficulty of Paying Living Expenses: Not very hard  Food Insecurity: No Food Insecurity (11/25/2023)   Hunger Vital Sign    Worried About Running Out of Food in the Last Year: Never true    Ran Out of Food in the Last Year: Never true  Transportation Needs: No Transportation Needs (11/25/2023)   PRAPARE - Administrator, Civil Service (Medical): No    Lack of Transportation (Non-Medical): No  Physical Activity: Not on file  Stress: No Stress Concern Present (08/24/2020)   [employer] of Occupational Health - Occupational Stress Questionnaire    Feeling of Stress : Only a little  Social  Connections: Socially Integrated (08/24/2020)   Social Connection and Isolation Panel [NHANES]    Frequency of Communication with Friends and Family: More than three times a week    Frequency of Social Gatherings with Friends and Family: More than three times a week    Attends Religious Services: More than 4 times per year    Active Member of [employer] or Organizations: Yes    Attends Banker Meetings: 1 to 4 times per year    Marital Status: Married  Catering manager Violence: Not At Risk (11/25/2023)   Humiliation, Afraid, Rape, and Kick questionnaire    Fear of Current or Ex-Partner: No    Emotionally Abused: No    Physically Abused: No    Sexually Abused: No   He does bring up he would like counseling for him and his wife.  Gives an example of his wife wanting to go on vacation by herself and he is not okay with that.   Review of Systems  Eyes:  Positive for visual disturbance (Near vision).  Respiratory:  Positive for shortness of breath (Generally has mild symptoms of everything asked-- mild dyspnea at times. Unable to adequately explain).   Cardiovascular:  Positive for chest pain (As in shortness of breath--pinches at bilateral chest and states has pinching pain at time.).  Gastrointestinal:  Negative for blood in stool (no melena.).  Genitourinary:        States he is having cramps in genital area, but unable to localize exactly where.   Cannot say if there is any activity associated with the discomfort.   Yesterday, just awakened with it.   Has had for many years.   No discharge from penis.   No redness or swelling of genital area.      Objective:   BP 110/70 (BP Location: Left Arm, Patient Position: Sitting, Cuff Size: Normal)   Pulse 74   Resp 16   Ht 5\' 8"  (1.727 m)   Wt 212 lb (96.2 kg)   BMI 32.23 kg/m   Physical Exam Constitutional:      Appearance: He is obese.  HENT:     Head: Normocephalic and atraumatic.     Right Ear: Tympanic membrane,  ear canal and external ear normal.     Left Ear: Tympanic membrane, ear canal and external ear normal.     Nose: Nose normal.     Mouth/Throat:     Mouth: Mucous membranes are moist.     Pharynx: Oropharynx is clear.  Eyes:     Extraocular Movements: Extraocular movements intact.  Conjunctiva/sclera: Conjunctivae normal.     Pupils: Pupils are equal, round, and reactive to light.     Comments: Discs sharp  Neck:     Thyroid: No thyroid mass.  Cardiovascular:     Rate and Rhythm: Normal rate and regular rhythm.     Heart sounds: S1 normal and S2 normal. No murmur heard.    No friction rub. No S3 or S4 sounds.     Comments: No carotid bruits.  Carotid, radial, femoral, DP and PT pulses normal and equal.   Pulmonary:     Effort: Pulmonary effort is normal.     Breath sounds: Normal breath sounds and air entry.  Abdominal:     General: Bowel sounds are normal.     Palpations: Abdomen is soft. There is no mass.     Tenderness: There is no abdominal tenderness (mild epigastric.  No rebound or peritoneal signs.).     Hernia: No hernia is present. There is no hernia in the left inguinal area or right inguinal area.  Genitourinary:    Penis: Uncircumcised.      Testes:        Right: Mass, tenderness or swelling not present. Right testis is descended.        Left: Mass, tenderness or swelling not present. Left testis is descended.     Epididymis:     Right: No tenderness.     Left: No tenderness.  Musculoskeletal:        General: Normal range of motion.     Cervical back: Normal range of motion and neck supple.     Right lower leg: No edema.     Left lower leg: No edema.  Lymphadenopathy:     Head:     Right side of head: No submental or submandibular adenopathy.     Left side of head: No submental or submandibular adenopathy.     Cervical: No cervical adenopathy.     Upper Body:     Right upper body: No supraclavicular adenopathy.     Left upper body: No supraclavicular  adenopathy.     Lower Body: No right inguinal adenopathy. No left inguinal adenopathy.  Skin:    General: Skin is warm.     Capillary Refill: Capillary refill takes less than 2 seconds.     Findings: No rash.  Neurological:     General: No focal deficit present.     Mental Status: He is alert and oriented to person, place, and time.     Cranial Nerves: Cranial nerves 2-12 are intact.     Sensory: Sensation is intact.     Motor: Motor function is intact.     Coordination: Coordination is intact.     Gait: Gait is intact.     Deep Tendon Reflexes: Reflexes are normal and symmetric.  Psychiatric:        Mood and Affect: Mood is anxious.        Speech: Speech normal.        Behavior: Behavior normal. Behavior is cooperative.     Assessment & Plan   CPE FIT to return in 2 weeks.   Td vaccine Fasting labs in 1 week.  2.  Marital conflict:  referral to [facility], [facility] for counseling, initially individually.    3.   Need for dental care:  Dental referral  4.  Decreased visual acuity:  Optometry referral.

## 2023-12-05 ENCOUNTER — Other Ambulatory Visit (INDEPENDENT_AMBULATORY_CARE_PROVIDER_SITE_OTHER): Payer: Self-pay

## 2023-12-05 ENCOUNTER — Other Ambulatory Visit: Payer: Self-pay

## 2023-12-05 DIAGNOSIS — Z1211 Encounter for screening for malignant neoplasm of colon: Secondary | ICD-10-CM

## 2023-12-05 DIAGNOSIS — Z0000 Encounter for general adult medical examination without abnormal findings: Secondary | ICD-10-CM

## 2023-12-05 LAB — POC FIT TEST STOOL: Fecal Occult Blood: NEGATIVE

## 2023-12-05 NOTE — Addendum Note (Signed)
Addended by: Vanya Carberry, [place] N on: 12/05/2023 08:48 AM   Modules accepted: Orders

## 2023-12-06 LAB — LIPID PANEL

## 2023-12-06 LAB — CBC WITH DIFFERENTIAL/PLATELET
Basophils Absolute: 0 10*3/uL (ref 0.0–0.2)
Basos: 1 %
EOS (ABSOLUTE): 0.1 10*3/uL (ref 0.0–0.4)
Eos: 1 %
Hematocrit: 48.2 % (ref 37.5–51.0)
Hemoglobin: 15.9 g/dL (ref 13.0–17.7)
Immature Grans (Abs): 0 10*3/uL (ref 0.0–0.1)
Immature Granulocytes: 0 %
Lymphocytes Absolute: 2.3 10*3/uL (ref 0.7–3.1)
Lymphs: 37 %
MCH: 31.6 pg (ref 26.6–33.0)
MCHC: 33 g/dL (ref 31.5–35.7)
MCV: 96 fL (ref 79–97)
Monocytes Absolute: 0.4 10*3/uL (ref 0.1–0.9)
Monocytes: 7 %
Neutrophils Absolute: 3.3 10*3/uL (ref 1.4–7.0)
Neutrophils: 54 %
Platelets: 203 10*3/uL (ref 150–450)
RBC: 5.03 x10E6/uL (ref 4.14–5.80)
RDW: 12.5 % (ref 11.6–15.4)
WBC: 6 10*3/uL (ref 3.4–10.8)

## 2023-12-06 LAB — COMPREHENSIVE METABOLIC PANEL

## 2023-12-06 LAB — LIPID PANEL W/O CHOL/HDL RATIO
Cholesterol, Total: 190 mg/dL (ref 100–199)
HDL: 58 mg/dL (ref >39)
LDL Chol Calc (NIH): 124 mg/dL — ABNORMAL HIGH (ref 0–99)
Triglycerides: 40 mg/dL (ref 0–149)
VLDL Cholesterol Cal: 8 mg/dL (ref 5–40)

## 2023-12-11 LAB — LIPID PANEL
Chol/HDL Ratio: 3.4 {ratio} (ref 0.0–5.0)
Cholesterol, Total: 195 mg/dL (ref 100–199)
HDL: 57 mg/dL (ref >39)
LDL Chol Calc (NIH): 129 mg/dL — ABNORMAL HIGH (ref 0–99)
Triglycerides: 46 mg/dL (ref 0–149)
VLDL Cholesterol Cal: 9 mg/dL (ref 5–40)

## 2023-12-11 LAB — COMPREHENSIVE METABOLIC PANEL
ALT: 29 [IU]/L (ref 0–44)
AST: 32 [IU]/L (ref 0–40)
Albumin: 4.7 g/dL (ref 4.1–5.1)
Alkaline Phosphatase: 151 IU/L — ABNORMAL HIGH (ref 44–121)
BUN/Creatinine Ratio: 23 — ABNORMAL HIGH (ref 9–20)
BUN: 20 mg/dL (ref 6–24)
Bilirubin Total: 0.5 mg/dL (ref 0.0–1.2)
CO2: 19 mmol/L — ABNORMAL LOW (ref 20–29)
Calcium: 9.2 mg/dL (ref 8.7–10.2)
Chloride: 103 mmol/L (ref 96–106)
Creatinine, Ser: 0.87 mg/dL (ref 0.76–1.27)
Globulin, Total: 2.5 g/dL (ref 1.5–4.5)
Glucose: 84 mg/dL (ref 70–99)
Potassium: 4.5 mmol/L (ref 3.5–5.2)
Sodium: 141 mmol/L (ref 134–144)
Total Protein: 7.2 g/dL (ref 6.0–8.5)
eGFR: 111 mL/min/{1.73_m2} (ref >59)

## 2023-12-11 LAB — SPECIMEN STATUS REPORT

## 2024-06-18 ENCOUNTER — Telehealth: Payer: Self-pay | Admitting: Internal Medicine

## 2024-06-18 NOTE — Telephone Encounter (Signed)
 Patient needs an appointment for constipation.  Patient states constipation started a month ago but was able to continue having normal bowel movements.   Patient states two days ago constipation symptoms worsen, patient describes trying to have bowel movement being painful. Patient states there is times where he feels like he does not completely empty out bowels.   We will call patient if there is a cancellation

## 2024-06-22 NOTE — Telephone Encounter (Signed)
 Patient has been scheduled

## 2024-06-24 ENCOUNTER — Ambulatory Visit: Payer: Self-pay | Admitting: Internal Medicine

## 2024-06-24 VITALS — BP 128/78 | HR 74 | Resp 18 | Ht 68.0 in | Wt 200.0 lb

## 2024-06-24 DIAGNOSIS — K5900 Constipation, unspecified: Secondary | ICD-10-CM

## 2024-06-24 DIAGNOSIS — B354 Tinea corporis: Secondary | ICD-10-CM

## 2024-06-24 MED ORDER — TERBINAFINE HCL 1 % EX CREA: 1.0000 | TOPICAL_CREAM | Freq: Two times a day (BID) | CUTANEOUS | Status: AC

## 2024-06-24 MED ORDER — POLYETHYLENE GLYCOL 3350 17 GM/SCOOP PO POWD: ORAL | Status: AC

## 2024-06-24 MED ORDER — METAMUCIL SMOOTH TEXTURE 58.6 % PO POWD: ORAL | Status: AC

## 2024-06-24 NOTE — Progress Notes (Signed)
 Subjective:    Patient ID: Frank Wilcox, male    DOB: 09-15-1981, 43 y.o.   MRN: [id number]  [name] [phone] translation service used with me.   With Dr. Adella, Erminio Bloomer served as an interpretor.   I, doc in training under Dr. Adella, saw pt first. Then I presented H/P to Dr. Adella.   Dr. Adella did H/P, A/P, Counseling, and Discharge instructions (verbal and written) as well as orders while I was in the room. This is a record of both evaluations.   CC- constipation for 3 weeks  Constipation for 4 weeks and maybe since I started working late  Normally, once or twice a day. Now is every 3 days.  Stools are soft then gets hard.  The last part was hard.  NO prior h/o this.  No change in food but chewing food more and it has helped.  Reduced meat and has helped.   Normal diet is egg/meat/potatoe/rice/beans/cheese. Drinks 3 bottles of water per day.   Has not reduced the amount. Working a lot and feels tired.   In the past three months going to work earlier and coming home later.  In the past 3 days getting better. +abdomenal pain LLQ 2-3 years.  Comes and goes.  This increased now I am constipated.  No blood in the stool.   Sometimes it is normal and sometimes thin.  Little veggies once or twice a week.  No change in weight.   My spouse and daughter have constipation. No brother or sister with constipation.   Wife and daughter take a white powder 3 scoops for constipation with 15 oz of water once. He tried to take it but did not work.   Chewing more and eating less meat helped.  No increase in worries and anxieties.  Feels cold more for 2 months..   No hair loss. No increase in skin dryness. No change in hair.wants his liver and kidneys checked to make sure things are ok.  Last time checked 4 months.  Wants to do liver and colon cleanse. He was told that he might have stones.  Eats out a lot.  Does not eat veggies because he does not crave it.   Tried to dec food a little hoping it would  help the cosntipation. Has had an appendectomy more than 10 years.     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 Negative for appetite change and unexpected weight change.  Gastrointestinal:  Positive for abdominal pain (LLQ when you push on it.) and constipation. Negative for abdominal distention, blood in stool, diarrhea, nausea and vomiting.  Psychiatric/Behavioral:  Negative for agitation and sleep disturbance.   No Known Allergies   Past Medical History:  Diagnosis Date   Alcohol abuse    Anxiety    Esophageal reflux    H. pylori infection 2017   Resolved with negative stool antigen in 2018   Sleep disturbance, unspecified    Unspecified gastritis and gastroduodenitis without mention of hemorrhage     Past Surgical History:  Procedure Laterality Date   ANKLE SURGERY Right 2007   ORIF right ankle fractures--work related injury   LAPAROSCOPIC APPENDECTOMY  06/06/2012   Procedure: APPENDECTOMY LAPAROSCOPIC;  Surgeon: Dann FORBES Hummer, MD;  Location: [facility] OR;  Service: General;  Laterality: N/A;   Fhx and SoHx- see HPI.       Objective:   Physical Exam Constitutional:      General: He is  not in acute distress.    Appearance: Normal appearance. He is normal weight. He is not ill-appearing, toxic-appearing or diaphoretic.     Comments: Slight overweight  HENT:     Head: Normocephalic and atraumatic.     Ears:     Comments: Normal hearing    Mouth/Throat:     Mouth: Mucous membranes are dry.  Eyes:     General: Lids are normal.  Pulmonary:     Effort: Pulmonary effort is normal.  Abdominal:     General: There is no distension.     Palpations: There is no mass.     Tenderness: There is abdominal tenderness (tender LLQ without rebound. + colon feels firm.). There is no guarding.     Comments: Hyperactive Bowel sounds in the LLQ   Genitourinary:    Comments: Rectal- not done.  Dr. Adella felt it would not change  management and would caue pt unnecessary anxiety.  Skin:    Comments: 2 cm round rash with central clearing.  Rash red and rough in the post aspect of the upper left leg.   Another one, 1 by 1/5 cm on the ant left upper leg that is red and rough. No cellulitic changes.  No eczempatous changed.  Back- some area of skin darkiening about 10 cm by 5 cm with irregualar borders.  Says it feels good when you touch it.     Back and scalp no rash.     Buttock and genital ara not examined.  Pt advised to check these areas in a mirror or have his wife check it to see if there is a similar rash as the feet.  Neurological:     Mental Status: He is alert and oriented to person, place, and time.     Comments: No obvious deficitis  Psychiatric:        Mood and Affect: Mood normal.        Behavior: Behavior normal.        Thought Content: Thought content normal.        Judgment: Judgment normal.      Assessment & Plan:  1- acute constipation 3 weeks-  advised to increase water,   make time for going to bathroom as his schedule has gotten busier.  Increase vegetables to 5 S/day and fruits to 2 S/day.  Dec. Starches. Dietary counseling by Dr. Adella.     Asked to eat veggies and fruits despite not craving it.    Patient told that there is not evidence that liver cleanse and colon cleanse works. -Murelax -metamucil -TSH level  2- Ringworm of left leg times 2.  Terbinafine  cream. Check all the kids for ringworm.  Asked to check private areas in a mirror or have his wife check it and use cream if he finds any more of the rash there.  3-LLQ abdominal pain- most likely related to the constipation.No acute abdomen.  If not better after treatment for constipation, or worse,call clinic .     4-RTC PRN and for Health maintenance visit on 11/20/24. Labs prior to visit.  Add hep C screening.   Pt expressed understanding.

## 2024-07-03 LAB — TSH: TSH: 1.58 u[IU]/mL (ref 0.450–4.500)

## 2024-08-16 ENCOUNTER — Ambulatory Visit: Payer: Self-pay | Admitting: Internal Medicine

## 2024-08-16 ENCOUNTER — Encounter: Payer: Self-pay | Admitting: Internal Medicine

## 2024-08-16 NOTE — Progress Notes (Signed)
 Patient seen with Dr. Fleta.  Agree with evaluation and plan save for LLQ firmness.  Felt soft throughout abdomen.

## 2024-10-13 ENCOUNTER — Telehealth: Payer: Self-pay | Admitting: Internal Medicine

## 2024-10-13 NOTE — Telephone Encounter (Signed)
 Patient states for about a week he has been feeling his left arm and left leg numb and feels tingling , patient states at times he feels nauseous.   Patent states he does not have other symptoms .   We will call patient when we have a cancellation for a soon appointment.

## 2024-10-14 NOTE — Telephone Encounter (Signed)
 Patient has been scheduled

## 2024-10-19 ENCOUNTER — Ambulatory Visit: Payer: Self-pay | Admitting: Internal Medicine

## 2024-10-19 ENCOUNTER — Encounter: Payer: Self-pay | Admitting: Internal Medicine

## 2024-10-19 VITALS — BP 100/70 | HR 60 | Resp 20 | Ht 67.0 in | Wt 206.0 lb

## 2024-10-19 DIAGNOSIS — R202 Paresthesia of skin: Secondary | ICD-10-CM

## 2024-10-19 DIAGNOSIS — R519 Headache, unspecified: Secondary | ICD-10-CM

## 2024-10-19 MED ORDER — CITALOPRAM HYDROBROMIDE 10 MG PO TABS: 10.0000 mg | ORAL_TABLET | Freq: Every day | ORAL | 3 refills | Status: AC

## 2024-10-19 NOTE — Progress Notes (Unsigned)
" ° ° °  Subjective:    Patient ID: Frank Wilcox, male   DOB: 11-18-1980, 44 y.o.   MRN: [id number]   HPI  Frank Wilcox interprets  Left arm and leg goose bumps with tingling episodes for 3 weeks.    First episode was 3 weeks ago.  States first with goose bumps and then tingling started circumferentially around his right arm starting just above his elbow to his fingertips.  States at same time, he developed tingling in his right mid thigh circumferentially to his toes.  He became anxious.  Lasted about 1 minute.  States he gets up and moves around and the tingling goes away. In past 1.5 weeks and not always, he may have a temporal/parietal HA and/or nausea on the left with the tingling, but has had the HA without the tingling and vice versa.  HA feels like he hit his head there, as if it is bruised.  Not clear how long the nausea lasts and is not always consistent with the HA pain as well. Has had 5 similar episodes in past 3 weeks.  No associated weakness in same area.   He feels like he gets panicked whenever he has these symptoms and it just gets stronger   He feels when he ignores the symptoms and moves about, it helps the symptoms resolve faster.   No other numbness, tingling, weakness elsewhere in body when occurs.     First episode, he was driving and thinking about work issues.  He does feel he is stressed with work related issues.  He states his work is more stressful in recent weeks.  He is being asked to travel from place to place and it is a lot.   Also, oldest son at home is not in school and not working.  This has stressed him as well.  Has had a couple of episodes where he just awakens with the symptoms.    Wife has seen him when having episode--he seems very anxious.    He has not restarted drinking alcohol with his increased stress.  Current Meds  Medication Sig   omeprazole  (PRILOSEC) 40 MG capsule 1 tab by mouth daily on empty stomach (Patient taking  differently: 1 tab by mouth daily on empty stomach Wants refill)   No Known Allergies   Review of Systems    Objective:   BP 100/70   Pulse 60   Resp 20   Ht 5' 7 (1.702 m)   Wt 206 lb (93.4 kg)   BMI 32.26 kg/m   Physical Exam   Assessment & Plan   "

## 2024-10-20 ENCOUNTER — Other Ambulatory Visit: Payer: Self-pay

## 2024-10-20 DIAGNOSIS — I8393 Asymptomatic varicose veins of bilateral lower extremities: Secondary | ICD-10-CM

## 2024-10-20 DIAGNOSIS — R519 Headache, unspecified: Secondary | ICD-10-CM

## 2024-10-20 DIAGNOSIS — B353 Tinea pedis: Secondary | ICD-10-CM

## 2024-10-20 DIAGNOSIS — K5900 Constipation, unspecified: Secondary | ICD-10-CM

## 2024-10-20 DIAGNOSIS — R202 Paresthesia of skin: Secondary | ICD-10-CM

## 2024-10-21 LAB — HEMOGLOBIN A1C
Est. average glucose Bld gHb Est-mCnc: 94 mg/dL
Hgb A1c MFr Bld: 4.9 % (ref 4.8–5.6)

## 2024-10-21 LAB — COMPREHENSIVE METABOLIC PANEL WITH GFR
ALT: 23 IU/L (ref 0–44)
AST: 20 IU/L (ref 0–40)
Albumin: 4.3 g/dL (ref 4.1–5.1)
Alkaline Phosphatase: 65 IU/L (ref 47–123)
BUN/Creatinine Ratio: 26 — ABNORMAL HIGH (ref 9–20)
BUN: 21 mg/dL (ref 6–24)
Bilirubin Total: 0.5 mg/dL (ref 0.0–1.2)
CO2: 23 mmol/L (ref 20–29)
Calcium: 9.2 mg/dL (ref 8.7–10.2)
Chloride: 104 mmol/L (ref 96–106)
Creatinine, Ser: 0.8 mg/dL (ref 0.76–1.27)
Globulin, Total: 2.4 g/dL (ref 1.5–4.5)
Glucose: 86 mg/dL (ref 70–99)
Potassium: 4.8 mmol/L (ref 3.5–5.2)
Sodium: 140 mmol/L (ref 134–144)
Total Protein: 6.7 g/dL (ref 6.0–8.5)
eGFR: 113 mL/min/1.73

## 2024-10-21 LAB — LIPID PANEL W/O CHOL/HDL RATIO
Cholesterol, Total: 199 mg/dL (ref 100–199)
HDL: 57 mg/dL
LDL Chol Calc (NIH): 130 mg/dL — ABNORMAL HIGH (ref 0–99)
Triglycerides: 64 mg/dL (ref 0–149)
VLDL Cholesterol Cal: 12 mg/dL (ref 5–40)

## 2024-10-21 LAB — CBC WITH DIFFERENTIAL/PLATELET
Basophils Absolute: 0.1 x10E3/uL (ref 0.0–0.2)
Basos: 1 %
EOS (ABSOLUTE): 0.1 x10E3/uL (ref 0.0–0.4)
Eos: 1 %
Hematocrit: 47.1 % (ref 37.5–51.0)
Hemoglobin: 15.3 g/dL (ref 13.0–17.7)
Immature Grans (Abs): 0 x10E3/uL (ref 0.0–0.1)
Immature Granulocytes: 0 %
Lymphocytes Absolute: 2.2 x10E3/uL (ref 0.7–3.1)
Lymphs: 34 %
MCH: 31.3 pg (ref 26.6–33.0)
MCHC: 32.5 g/dL (ref 31.5–35.7)
MCV: 96 fL (ref 79–97)
Monocytes Absolute: 0.6 x10E3/uL (ref 0.1–0.9)
Monocytes: 9 %
Neutrophils Absolute: 3.7 x10E3/uL (ref 1.4–7.0)
Neutrophils: 55 %
Platelets: 206 x10E3/uL (ref 150–450)
RBC: 4.89 x10E6/uL (ref 4.14–5.80)
RDW: 12.4 % (ref 11.6–15.4)
WBC: 6.6 x10E3/uL (ref 3.4–10.8)

## 2024-10-21 LAB — TSH: TSH: 0.981 u[IU]/mL (ref 0.450–4.500)

## 2024-10-21 LAB — VITAMIN B12: Vitamin B-12: 527 pg/mL (ref 232–1245)

## 2024-11-03 ENCOUNTER — Ambulatory Visit: Payer: Self-pay | Admitting: Internal Medicine

## 2024-11-03 DIAGNOSIS — R202 Paresthesia of skin: Secondary | ICD-10-CM

## 2024-11-03 DIAGNOSIS — F410 Panic disorder [episodic paroxysmal anxiety] without agoraphobia: Secondary | ICD-10-CM

## 2024-11-03 DIAGNOSIS — F411 Generalized anxiety disorder: Secondary | ICD-10-CM

## 2024-11-03 NOTE — Progress Notes (Unsigned)
" ° ° °  Subjective:    Patient ID: Frank Wilcox, male   DOB: 24-Dec-1980, 44 y.o.   MRN: [id number]   HPI  Telephone visit Frank Wilcox interprets  Anxiety:  Taking Citalopram  10 mg daily for around 15 days.  No longer having episodes of goose bumps or tingling, unless thinks about the symptoms, then may get some milder symptoms.  Feeling better.  Discussed CBC, CMP, A1C, TSH, Cholesterol, Vitamin B12 levels all okay.  Current Meds  Medication Sig   citalopram  (CELEXA ) 10 MG tablet Take 1 tablet (10 mg total) by mouth daily.   loratadine  (CLARITIN ) 10 MG tablet Take 1 tablet (10 mg total) by mouth daily.   mometasone  (NASONEX ) 50 MCG/ACT nasal spray Place 2 sprays into the nose daily.   omeprazole  (PRILOSEC) 40 MG capsule 1 tab by mouth daily on empty stomach   terbinafine  (LAMISIL ) 1 % cream Apply 1 Application topically 2 (two) times daily. Aplica dos veces al dia para sus pies y dedos por mas or menos 14 dias   No Known Allergies   Review of Systems    Objective:   There were no vitals taken for this visit.  Physical Exam   Assessment & Plan  Anxiety/panic disorder:  doing much better.   Asked him to take Citalopram  for at least 1 year before considering weaning. Has appt in Feb with me.    "

## 2024-11-20 ENCOUNTER — Other Ambulatory Visit: Payer: Self-pay

## 2024-11-20 DIAGNOSIS — Z0000 Encounter for general adult medical examination without abnormal findings: Secondary | ICD-10-CM

## 2024-11-24 ENCOUNTER — Encounter: Payer: Self-pay | Admitting: Internal Medicine
